# Patient Record
Sex: Male | Born: 1965 | Race: Black or African American | Marital: Single
Health system: Northeastern US, Community
[De-identification: ages and names within clinical notes are randomized; demographics above are authoritative.]

## PROBLEM LIST (undated history)

## (undated) DIAGNOSIS — I10 Essential (primary) hypertension: Secondary | ICD-10-CM

## (undated) DIAGNOSIS — J45909 Unspecified asthma, uncomplicated: Secondary | ICD-10-CM

## (undated) DIAGNOSIS — M1990 Unspecified osteoarthritis, unspecified site: Secondary | ICD-10-CM

## (undated) DIAGNOSIS — J189 Pneumonia, unspecified organism: Secondary | ICD-10-CM

## (undated) DIAGNOSIS — E119 Type 2 diabetes mellitus without complications: Secondary | ICD-10-CM

## (undated) HISTORY — PX: ROTATOR CUFF REPAIR: SHX139

## (undated) HISTORY — PX: HIP SURGERY: SHX245

---

## 2014-06-25 ENCOUNTER — Encounter (HOSPITAL_BASED_OUTPATIENT_CLINIC_OR_DEPARTMENT_OTHER): Payer: Self-pay

## 2014-06-25 ENCOUNTER — Emergency Department (HOSPITAL_BASED_OUTPATIENT_CLINIC_OR_DEPARTMENT_OTHER)
Admission: RE | Admit: 2014-06-25 | Payer: Self-pay | Source: Emergency Department | Attending: Emergency Medicine | Admitting: Emergency Medicine

## 2014-06-25 HISTORY — DX: Essential (primary) hypertension: I10

## 2014-06-25 LAB — BASIC METABOLIC PANEL
ANION GAP: 7 mmol/L (ref 5–15)
BUN (UREA NITROGEN): 27 mg/dL — ABNORMAL HIGH (ref 7–18)
CALCIUM: 9 mg/dL (ref 8.5–10.1)
CARBON DIOXIDE: 27 mmol/L (ref 21–32)
CHLORIDE: 107 mmol/L (ref 98–107)
CREATININE: 0.9 mg/dL (ref 0.7–1.2)
ESTIMATED GLOMERULAR FILT RATE: >60 mL/min (ref >60)
Glucose Random: 115 mg/dL (ref 74–160)
POTASSIUM: 4.4 mmol/L (ref 3.5–5.1)
SODIUM: 141 mmol/L (ref 136–145)

## 2014-06-25 LAB — HOLD GREEN TOP TUBE

## 2014-06-25 MED ORDER — PROCHLORPERAZINE EDISYLATE 5 MG/ML IJ SOLN
10.00 mg | Freq: Once | INTRAMUSCULAR | Status: AC
Start: 2014-06-25 — End: 2014-06-25
  Administered 2014-06-25: 10 mg via INTRAVENOUS
  Filled 2014-06-25: qty 2

## 2014-06-25 MED ORDER — SODIUM CHLORIDE 0.9 % IV BOLUS
1000.0000 mL | Freq: Once | INTRAVENOUS | Status: AC
Start: 2014-06-25 — End: 2014-06-25
  Administered 2014-06-25: 1000 mL via INTRAVENOUS

## 2014-06-25 NOTE — ED Triage Note (Signed)
P/W elevated BP, H/A and nausea.  + photophobia.  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  178/118.  Pt was given script for Lisinopril but has yet to take.

## 2014-06-25 NOTE — ED Provider Notes (Signed)
The patient was seen primarily by me. ED nursing record was reviewed. Select prior records as available electronically through the Epic record were reviewed.      HPI:    Kevin Gill is a 49 year old male patient who presents with headache.  The headache started gradually about 2 hours prior to coming to the emergency department.  It is similar to headaches that he has had in the past.  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  The pressure was approximately 180/110.  He called his doctor, thinking that his blood pressure may be responsible for the headache.  His doctor told him to proceed here.    The patient has a history of hypertension.  He was just started on new medication yesterday at his annual visit with his doctor, but had yet to pick up the medication.  He did take a dose of his lisinopril prior to arriving here today.    On review of systems, the patient denies any chest pain or shortness of breath.  He has had some nausea, photophobia, and phonophobia accompanying this headache, as he has had before.    ROS: Pertinent positives were reviewed as per the HPI above.     All other systems were reviewed and are negative.    Kevin Gill  Language of care: English  MRN: [id number]  PCP: No primary care provider on file.  Mode of arrival to ED: Relative.  Arrival time: 06/25/2014 10:41 AM  Chief complaint: Headache (NAUSEA,HEAD ACHE AND ?HIGH BP)    Past Medical History/Problem list:    Past Medical History    HTN (hypertension)      There is no problem list on file for this patient.    Past Surgical History: History reviewed. No pertinent past surgical history.  Social History:   Tobacco:No     Allergies: Review of Patient's Allergies indicates:  No Known Allergies    Immunizations:   There is no immunization history on file for this patient.  Family Hx: noncontributory     Triage Vital Signs:   ED Triage Vitals   Enc Vitals Group       BP 06/25/14 1124 152/91      Pulse 06/25/14 1124 97      Resp 06/25/14 1124 16      Temp 06/25/14 1124 98.6 F      Temp src --       SpO2 06/25/14 1124 98 %      Weight --       Height --       Head Cir --       Peak Flow --       Pain Score --       Pain Loc --       Pain Edu? --       Excl. in GC? --        Medications:  Prior to Admission Medications   Prescriptions Last Dose Informant Patient Reported? Taking?   LISINOPRIL PO   Yes Yes   Sig: Take by mouth      Facility-Administered Medications: None     Physical Exam:   GENERAL: No acute distress, non-toxic     HEAD: No evidence of trauma.   Sclerae anicteric.   Normal posterior oropharynx. Moist mucous membranes.   Pupils equal & reactive, extraocular movements intact.   Visual fields intact to confrontation  NECK: Supple. No stridor.    LUNGS: Clear to auscultation bilaterally. No wheezes, rales, rhonchi.     HEART: Regular rate & rhythm.  No murmurs appreciated.      ABDOMEN: Soft, nontender. No masses appreciated.  No involuntary guarding or rebound tenderness.     EXTREMITIES: No obvious deformities or trauma.  Warm and well perfused.  No edema.     SKIN: Warm and dry, no rash    NEUROLOGIC: Alert and oriented x3; moves all extremities well; speaking in clear fluent sentences.  Cranial nerves II through XII are intact.  Finger-nose testing is within normal limits.  Normal gait without ataxia. Sensation intact to light touch throughout.     PSYCHIATRIC: Appropriate, cooperative.       Medications Given in the ED:  Medications - No data to display Radiology Results:  N/A   Lab Results :  Results for orders placed or performed during the hospital encounter of 06/25/14 (from the past 24 hour(s))   Basic Metabolic Panel    Collection Time: 06/25/14 12:22 PM   Result Value    SODIUM 141    POTASSIUM 4.4    CHLORIDE 107    CARBON DIOXIDE 27    ANION GAP 7    CALCIUM 9.0    Glucose Random 115    BUN (UREA NITROGEN) 27 (H)    CREATININE 0.9    ESTIMATED  GLOMERULAR FILT RATE > 60   Hold Green Top Tube    Collection Time: 06/25/14 12:22 PM   Result Value    HOLD GREEN TOP TUBE RECEIVED IN CHEM    Other Results (e.g. ECG):  Normal sinus rhythm at 62.  Normal axis, QRS 106.  No acute ischemic changes or LVH.     ED Course and Medical Decision-making:  49 year old male patient signs and symptoms consistent with a common migraine headache.  The patient denies having any type of an aura.  His pressure does not seem to have been high enough to think that this might of been a hypertensive urgency.    His neurologic examination is reassuring.  EKG does not reveal any LVH or acute ischemia, and the patient's BMP was within normal limits.    The patient received Compazine for symptom relief here in the emergency department, and I was hoping to reassess him.  However, the patient eloped from the emergency department and returned to work.  Nursing was later able to call him at his place of business, and the patient reported feeling much better and not wanting to disturb us while you're seeing other patients.    Disposition: Eloped    Diagnosis/Diagnoses:  1.  Headache, possible migraine  2.  Hypertension     Huntley EstelleStephen Willim Turnage, MD  06/25/2014   Dept.of Emergency Medicine  [facility]    This Emergency Department patient encounter note was created using voice-recognition software and in real time during the ED visit. Please excuse any typographical errors that have not yet been reviewed and corrected.

## 2014-06-25 NOTE — Narrator Note (Signed)
Pt was feeling much better after med and IV fluid.  Pt wanted to leave.  Pt pulled out his IV and placed in sharps container.  Pt eloped.  Pt was called at his place of work to f/u and confirm IV was removed.  Pt stated he is now pain free and back at work.  " I saw there were many more people sicker than me and I know you needed to help them so I left...Marland Kitchen.Marland Kitchen.Im  sorry"

## 2014-06-25 NOTE — Discharge Instructions (Signed)

## 2014-06-28 LAB — EKG

## 2016-04-04 ENCOUNTER — Emergency Department (HOSPITAL_COMMUNITY)
Admission: EM | Admit: 2016-04-04 | Discharge: 2016-04-04 | Disposition: A | Discharge status: Home / Self Care | Payer: Managed Care, Other (non HMO) | Attending: Emergency Medicine | Admitting: Emergency Medicine

## 2016-04-04 ENCOUNTER — Encounter (HOSPITAL_COMMUNITY): Payer: Self-pay

## 2016-04-04 DIAGNOSIS — E1165 Type 2 diabetes mellitus with hyperglycemia: Secondary | ICD-10-CM | POA: Insufficient documentation

## 2016-04-04 DIAGNOSIS — R739 Hyperglycemia, unspecified: Secondary | ICD-10-CM

## 2016-04-04 HISTORY — DX: Type 2 diabetes mellitus without complications: E11.9

## 2016-04-04 LAB — URINALYSIS, ROUTINE W REFLEX MICROSCOPIC
BACTERIA UA: NONE SEEN
Bilirubin Urine: NEGATIVE
Glucose, UA: >=500 mg/dL — AB
Ketones, ur: 20 mg/dL — AB
Leukocytes, UA: NEGATIVE
Nitrite: NEGATIVE
PROTEIN: 30 mg/dL — AB
Specific Gravity, Urine: 1.026 (ref 1.005–1.030)
pH: 5 (ref 5.0–8.0)

## 2016-04-04 LAB — CBG MONITORING, ED
GLUCOSE-CAPILLARY: 343 mg/dL — AB (ref 65–99)
Glucose-Capillary: 383 mg/dL — ABNORMAL HIGH (ref 65–99)
Glucose-Capillary: 276 mg/dL — ABNORMAL HIGH (ref 65–99)

## 2016-04-04 LAB — COMPREHENSIVE METABOLIC PANEL
ALBUMIN: 4.5 g/dL (ref 3.5–5.0)
ALT: 31 U/L (ref 17–63)
AST: 25 U/L (ref 15–41)
Alkaline Phosphatase: 81 U/L (ref 38–126)
Anion gap: 16 — ABNORMAL HIGH (ref 5–15)
BILIRUBIN TOTAL: 1 mg/dL (ref 0.3–1.2)
BUN: 23 mg/dL — AB (ref 6–20)
CO2: 22 mmol/L (ref 22–32)
Calcium: 9.5 mg/dL (ref 8.9–10.3)
Chloride: 94 mmol/L — ABNORMAL LOW (ref 101–111)
Creatinine, Ser: 1.53 mg/dL — ABNORMAL HIGH (ref 0.61–1.24)
GFR calc Af Amer: 59 mL/min — ABNORMAL LOW (ref >60)
GFR calc non Af Amer: 51 mL/min — ABNORMAL LOW (ref >60)
Glucose, Bld: 384 mg/dL — ABNORMAL HIGH (ref 65–99)
POTASSIUM: 4.1 mmol/L (ref 3.5–5.1)
Sodium: 132 mmol/L — ABNORMAL LOW (ref 135–145)
TOTAL PROTEIN: 7.6 g/dL (ref 6.5–8.1)

## 2016-04-04 LAB — I-STAT VENOUS BLOOD GAS, ED
Bicarbonate: 24 mmol/L (ref 20.0–28.0)
O2 Saturation: 85 %
PCO2 VEN: 37.7 mmHg — AB (ref 44.0–60.0)
PO2 VEN: 49 mmHg — AB (ref 32.0–45.0)
TCO2: 25 mmol/L (ref 0–100)
pH, Ven: 7.412 (ref 7.250–7.430)

## 2016-04-04 LAB — CBC
HEMATOCRIT: 42.3 % (ref 39.0–52.0)
Hemoglobin: 14.6 g/dL (ref 13.0–17.0)
MCH: 30.6 pg (ref 26.0–34.0)
MCHC: 34.5 g/dL (ref 30.0–36.0)
MCV: 88.7 fL (ref 78.0–100.0)
Platelets: 325 10*3/uL (ref 150–400)
RBC: 4.77 MIL/uL (ref 4.22–5.81)
RDW: 12.7 % (ref 11.5–15.5)
WBC: 8.4 10*3/uL (ref 4.0–10.5)

## 2016-04-04 LAB — I-STAT TROPONIN, ED: Troponin i, poc: 0 ng/mL (ref 0.00–0.08)

## 2016-04-04 MED ORDER — SODIUM CHLORIDE 0.9 % IV BOLUS (SEPSIS)
1000.0000 mL | Freq: Once | INTRAVENOUS | Status: AC
  Administered 2016-04-04: 1000 mL via INTRAVENOUS

## 2016-04-04 MED ORDER — INSULIN ASPART 100 UNIT/ML ~~LOC~~ SOLN
10.0000 [IU] | Freq: Once | SUBCUTANEOUS | Status: AC
  Administered 2016-04-04: 10 [IU] via INTRAVENOUS
  Filled 2016-04-04: qty 1

## 2016-04-04 MED ORDER — ONDANSETRON HCL 4 MG/2ML IJ SOLN
4.0000 mg | Freq: Once | INTRAMUSCULAR | Status: AC
  Administered 2016-04-04: 4 mg via INTRAVENOUS
  Filled 2016-04-04: qty 2

## 2016-04-04 NOTE — ED Notes (Signed)
Pt fluids still running, per Dr. Anitra LauthPlunkett will recheck pt CBG after second bolus complete.

## 2016-04-04 NOTE — ED Notes (Signed)
ED Provider at bedside. 

## 2016-04-04 NOTE — ED Provider Notes (Signed)
[facility]-EMERGENCY DEPT Provider Note   CSN: [id number] Arrival date & time: 04/04/16  1550     History   Chief Complaint Chief Complaint  Patient presents with  . Hyperglycemia    HPI Kenneth Barron is a 51 y.o. male.  Patient is a 51 year old male with a history of diabetes who is currently on metformin presenting today with 5 days of abdominal cramping, intermittent vomiting, polyuria, polydipsia and feeling lightheaded today. He denies any shortness of breath or chest pain.  He was seen by his PCP today and found to have an elevated blood sugar of 400. He was sent here for further care. He denies any cough, congestion, shortness of breath, chest pain. No recent illness with fever. He denies any dysuria. He initially was having diarrhea several days ago but has had no further diarrhea today. Eating seems to make his symptoms worse. He was only able to eat several strawberries today. Then he started to feel nauseated. He recently started juicing in the last 10 days and thinks that that might be the cause of his elevated blood sugar. 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   The history is provided by the patient.    No past medical history on file.  There are no active problems to display for this patient.   No past surgical history on file.     Home Medications    Prior to Admission medications   Not on File    Family History No family history on file.  Social History Social History  Substance Use Topics  . Smoking status: Not on file  . Smokeless tobacco: Not on file  . Alcohol use Not on file     Allergies   Patient has no allergy information on record.   Review of Systems Review of Systems  All other systems reviewed and are negative.    Physical Exam Updated Vital Signs BP 146/69 (BP Location: Left Arm)   Pulse 73   Resp 18   SpO2 100%   Physical Exam   Constitutional: He is oriented to person, place, and time. He appears well-developed and well-nourished. No distress.  HENT:  Head: Normocephalic and atraumatic.  Mouth/Throat: Oropharynx is clear and moist. Mucous membranes are dry.  Eyes: Conjunctivae and EOM are normal. Pupils are equal, round, and reactive to light.  Neck: Normal range of motion. Neck supple.  Cardiovascular: Normal rate, regular rhythm and intact distal pulses.   No murmur heard. Pulmonary/Chest: Effort normal and breath sounds normal. No respiratory distress. He has no wheezes. He has no rales.  Abdominal: Soft. He exhibits no distension. There is no tenderness. There is no rebound and no guarding.  Musculoskeletal: Normal range of motion. He exhibits no edema or tenderness.  Neurological: He is alert and oriented to person, place, and time.  Skin: Skin is warm and dry. No rash noted. No erythema.  Psychiatric: He has a normal mood and affect. His behavior is normal.  Nursing note and vitals reviewed.    ED Treatments / Results  Labs (all labs ordered are listed, but only abnormal results are displayed) Labs Reviewed  URINALYSIS, ROUTINE W REFLEX MICROSCOPIC - Abnormal; Notable for the following:       Result Value   Glucose, UA >=500 (*)    Hgb urine dipstick MODERATE (*)    Ketones, ur 20 (*)    Protein, ur  30 (*)    Squamous Epithelial / LPF 0-5 (*)    All other components within normal limits  COMPREHENSIVE METABOLIC PANEL - Abnormal; Notable for the following:    Sodium 132 (*)    Chloride 94 (*)    Glucose, Bld 384 (*)    BUN 23 (*)    Creatinine, Ser 1.53 (*)    GFR calc non Af Amer 51 (*)    GFR calc Af Amer 59 (*)    Anion gap 16 (*)    All other components within normal limits  CBG MONITORING, ED - Abnormal; Notable for the following:    Glucose-Capillary 383 (*)    All other components within normal limits  CBG MONITORING, ED - Abnormal; Notable for the following:    Glucose-Capillary  343 (*)    All other components within normal limits  I-STAT VENOUS BLOOD GAS, ED - Abnormal; Notable for the following:    pCO2, Ven 37.7 (*)    pO2, Ven 49.0 (*)    All other components within normal limits  CBG MONITORING, ED - Abnormal; Notable for the following:    Glucose-Capillary 276 (*)    All other components within normal limits  CBC  I-STAT TROPOININ, ED  CBG MONITORING, ED    EKG  EKG Interpretation  Date/Time:  Tuesday April 04 2016 16:00:42 EST Ventricular Rate:  81 PR Interval:  146 QRS Duration: 112 QT Interval:  384 QTC Calculation: 446 R Axis:   56 Text Interpretation:  Normal sinus rhythm Nonspecific ST and T wave abnormality No previous tracing Confirmed by Anitra LauthPLUNKETT  MD, Alphonzo LemmingsWHITNEY ([id number]) on 04/04/2016 4:04:39 PM       Radiology No results found.  Procedures Procedures (including critical care time)  Medications Ordered in ED Medications  ondansetron (ZOFRAN) injection 4 mg (not administered)  sodium chloride 0.9 % bolus 1,000 mL (not administered)     Initial Impression / Assessment and Plan / ED Course  I have reviewed the triage vital signs and the nursing notes.  Pertinent labs & imaging results that were available during my care of the patient were reviewed by me and considered in my medical decision making (see chart for details).     Patient presenting from PCP office with hyperglycemia and rule out DKA. Patient does not smell of ketones on exam but has been having vomiting, abdominal cramping and elevated blood sugar despite taking his metformin. He has been juicing the last 10 days which may also be a cause of his elevated blood sugar. He denies any infectious symptoms.  He denies any chest pain, shortness of breath but was lightheaded today. His EKG is abnormal with nonspecific ST and T-wave abnormalities in the inferior lateral leads however there is no old to compare. Blood sugar here is 383 and patient was started on IV fluids and  Zofran. CBC, CMP, troponin, UA, VBG pending  7:50 PM Labs without strong evidence for DKA. After 2 L of fluid will repeat check CBG. Patient given some diet ginger ale and crackers.  Repeat blood sugar after 2L is unchanged at 343.  Pt given insulin. And will recheck  10:42 PM After insulin pt's BS is now in the 200's.  Pt tolerating po's.  Will d/c home and has pcp f/u.  Final Clinical Impressions(s) / ED Diagnoses   Final diagnoses:  Hyperglycemia    New Prescriptions New Prescriptions   No medications on file     Gwyneth SproutWhitney Wiley Flicker, MD 04/04/16 2242

## 2016-04-04 NOTE — ED Triage Notes (Signed)
Pt presents via EMS from PCP for hyperglycemia. Per EMS, pt to PCP for increased thirst, N/V, "stomach turning", and "feeling foggy". CBG 362 at PCP, CBG 437 en route with EMS. Pt with hx Type 2 Dm. 128/76, HR 70, RR 16. 20 G in L AC.

## 2016-04-20 ENCOUNTER — Encounter (HOSPITAL_COMMUNITY): Payer: Self-pay

## 2016-04-20 ENCOUNTER — Emergency Department (HOSPITAL_COMMUNITY)
Admission: EM | Admit: 2016-04-20 | Discharge: 2016-04-20 | Disposition: A | Discharge status: Home / Self Care | Payer: Managed Care, Other (non HMO) | Attending: Emergency Medicine | Admitting: Emergency Medicine

## 2016-04-20 DIAGNOSIS — Z5321 Procedure and treatment not carried out due to patient leaving prior to being seen by health care provider: Secondary | ICD-10-CM | POA: Insufficient documentation

## 2016-04-20 DIAGNOSIS — R739 Hyperglycemia, unspecified: Secondary | ICD-10-CM | POA: Insufficient documentation

## 2016-04-20 LAB — CBG MONITORING, ED: Glucose-Capillary: 355 mg/dL — ABNORMAL HIGH (ref 65–99)

## 2016-04-20 NOTE — ED Triage Notes (Signed)
Pt reports he has been suffering from a head cold for about a week. He checked his blood sugar today and it was 509. He reports headache and nausea. CBG at triage 355.

## 2016-04-20 NOTE — ED Notes (Signed)
Pt reports "since my blood sugar wasn't as high as my meter read" he politely requested to be discharged and declined any further testing or blood draw. Pt encouraged to wait to be evaluated by a provider and they can discuss his plan of care.

## 2016-04-20 NOTE — ED Notes (Signed)
Pt called x2 with no response °

## 2017-04-13 ENCOUNTER — Encounter (HOSPITAL_COMMUNITY): Payer: Self-pay

## 2017-04-13 ENCOUNTER — Emergency Department (HOSPITAL_COMMUNITY)
Admission: EM | Admit: 2017-04-13 | Discharge: 2017-04-13 | Disposition: A | Discharge status: Home / Self Care | Payer: BLUE CROSS/BLUE SHIELD | Attending: Emergency Medicine | Admitting: Emergency Medicine

## 2017-04-13 ENCOUNTER — Other Ambulatory Visit: Payer: Self-pay

## 2017-04-13 ENCOUNTER — Emergency Department (HOSPITAL_COMMUNITY): Payer: BLUE CROSS/BLUE SHIELD

## 2017-04-13 DIAGNOSIS — Z79899 Other long term (current) drug therapy: Secondary | ICD-10-CM | POA: Diagnosis not present

## 2017-04-13 DIAGNOSIS — I10 Essential (primary) hypertension: Secondary | ICD-10-CM | POA: Insufficient documentation

## 2017-04-13 DIAGNOSIS — E860 Dehydration: Secondary | ICD-10-CM | POA: Insufficient documentation

## 2017-04-13 DIAGNOSIS — R112 Nausea with vomiting, unspecified: Secondary | ICD-10-CM | POA: Diagnosis present

## 2017-04-13 DIAGNOSIS — Z794 Long term (current) use of insulin: Secondary | ICD-10-CM | POA: Diagnosis not present

## 2017-04-13 DIAGNOSIS — E119 Type 2 diabetes mellitus without complications: Secondary | ICD-10-CM | POA: Diagnosis not present

## 2017-04-13 HISTORY — DX: Essential (primary) hypertension: I10

## 2017-04-13 LAB — URINALYSIS, ROUTINE W REFLEX MICROSCOPIC
Bilirubin Urine: NEGATIVE
Glucose, UA: NEGATIVE mg/dL
Hgb urine dipstick: NEGATIVE
Ketones, ur: NEGATIVE mg/dL
Leukocytes, UA: NEGATIVE
Nitrite: NEGATIVE
Protein, ur: NEGATIVE mg/dL
Specific Gravity, Urine: 1.025 (ref 1.005–1.030)
pH: 5 (ref 5.0–8.0)

## 2017-04-13 LAB — COMPREHENSIVE METABOLIC PANEL
ALBUMIN: 4.1 g/dL (ref 3.5–5.0)
ALT: 19 U/L (ref 17–63)
ANION GAP: 9 (ref 5–15)
AST: 20 U/L (ref 15–41)
Alkaline Phosphatase: 92 U/L (ref 38–126)
BUN: 34 mg/dL — ABNORMAL HIGH (ref 6–20)
CHLORIDE: 105 mmol/L (ref 101–111)
CO2: 25 mmol/L (ref 22–32)
Calcium: 8.9 mg/dL (ref 8.9–10.3)
Creatinine, Ser: 1.14 mg/dL (ref 0.61–1.24)
GFR calc Af Amer: >60 mL/min (ref >60)
GFR calc non Af Amer: >60 mL/min (ref >60)
GLUCOSE: 129 mg/dL — AB (ref 65–99)
Potassium: 4.3 mmol/L (ref 3.5–5.1)
Sodium: 139 mmol/L (ref 135–145)
Total Bilirubin: 0.6 mg/dL (ref 0.3–1.2)
Total Protein: 7 g/dL (ref 6.5–8.1)

## 2017-04-13 LAB — CBC
HCT: 41.3 % (ref 39.0–52.0)
Hemoglobin: 13.5 g/dL (ref 13.0–17.0)
MCH: 32.4 pg (ref 26.0–34.0)
MCHC: 32.7 g/dL (ref 30.0–36.0)
MCV: 99 fL (ref 78.0–100.0)
Platelets: 290 K/uL (ref 150–400)
RBC: 4.17 MIL/uL — ABNORMAL LOW (ref 4.22–5.81)
RDW: 14.2 % (ref 11.5–15.5)
WBC: 6.3 K/uL (ref 4.0–10.5)

## 2017-04-13 LAB — CBG MONITORING, ED: Glucose-Capillary: 130 mg/dL — ABNORMAL HIGH (ref 65–99)

## 2017-04-13 LAB — TROPONIN I: Troponin I: <0.03 ng/mL (ref <0.03)

## 2017-04-13 LAB — LIPASE, BLOOD: LIPASE: 59 U/L — AB (ref 11–51)

## 2017-04-13 MED ORDER — METOCLOPRAMIDE HCL 5 MG/ML IJ SOLN
10.0000 mg | Freq: Once | INTRAMUSCULAR | Status: DC
  Filled 2017-04-13: qty 2

## 2017-04-13 MED ORDER — SODIUM CHLORIDE 0.9 % IV BOLUS (SEPSIS)
1000.0000 mL | Freq: Once | INTRAVENOUS | Status: AC
  Administered 2017-04-13: 1000 mL via INTRAVENOUS

## 2017-04-13 MED ORDER — ONDANSETRON HCL 4 MG/2ML IJ SOLN: 4.0000 mg | Freq: Once | INTRAMUSCULAR | Status: DC

## 2017-04-13 MED ORDER — IOPAMIDOL (ISOVUE-300) INJECTION 61%
INTRAVENOUS | Status: AC
  Administered 2017-04-13: 100 mL
  Filled 2017-04-13: qty 100

## 2017-04-13 MED ORDER — ONDANSETRON HCL 4 MG PO TABS: 4.0000 mg | ORAL_TABLET | Freq: Four times a day (QID) | ORAL | 0 refills | Status: DC

## 2017-04-13 MED ORDER — ONDANSETRON HCL 4 MG/2ML IJ SOLN
4.0000 mg | Freq: Once | INTRAMUSCULAR | Status: AC
  Administered 2017-04-13: 4 mg via INTRAVENOUS

## 2017-04-13 MED ORDER — KETOROLAC TROMETHAMINE 30 MG/ML IJ SOLN
15.0000 mg | Freq: Once | INTRAMUSCULAR | Status: AC
  Administered 2017-04-13: 15 mg via INTRAVENOUS
  Filled 2017-04-13: qty 1

## 2017-04-13 MED ORDER — ONDANSETRON HCL 4 MG/2ML IJ SOLN
4.0000 mg | Freq: Once | INTRAMUSCULAR | Status: DC | PRN
  Filled 2017-04-13: qty 2

## 2017-04-13 MED ORDER — SODIUM CHLORIDE 0.9 % IJ SOLN
INTRAMUSCULAR | Status: AC
  Filled 2017-04-13: qty 50

## 2017-04-13 NOTE — ED Triage Notes (Signed)
Patient BIB EMS from home with c/o Nausea/vomting today, and associated diarrhea last night. Patient reports feeling dizzy and weakness. 20G L Hand PIV placed by EMS. No medications administered by EMS. VSS

## 2017-04-13 NOTE — ED Notes (Signed)
Bed: WA05 Expected date:  Expected time:  Means of arrival:  Comments: EMS 

## 2017-04-13 NOTE — ED Provider Notes (Signed)
[facility]-EMERGENCY DEPT Provider Note   CSN: [id number] Arrival date & time: 04/13/17  1232     History   Chief Complaint Chief Complaint  Patient presents with  . Emesis  . Weakness    HPI Kenneth Barron is a 52 y.o. male with history of type 2 diabetes who presents with nausea, emesis, and generalized weakness.  He denies abdominal pain.  Patient reports his symptoms came on suddenly today. He did eat breakfast.  He  gave himself 4 units of insulin which did not seem to improve his symptoms, like it has in the past.. Patient denies any fevers, chest pain, shortness of breath.  Patient reports he has chronic diarrhea with his metformin.  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  Patient is being followed by orthopedics.  He denies any saddle anesthesia or loss of bowel or bladder incontinence.  Symptoms have increased since he saw orthopedics 1 month ago, however plans for MRI of his lumbar spine were discussed.  The symptoms are unchanged today since onset of his other symptoms.  HPI  Past Medical History:  Diagnosis Date  . Diabetes mellitus without complication (HCC)    Type 2  . Hypertension     There are no active problems to display for this patient.   History reviewed. No pertinent surgical history.     Home Medications    Prior to Admission medications   Medication Sig Start Date End Date Taking? Authorizing Provider  amLODipine (NORVASC) 10 MG tablet Take 10 mg by mouth daily. 02/13/17  Yes [provider]  atorvastatin (LIPITOR) 20 MG tablet Take 20 mg by mouth daily. 03/10/17  Yes [provider]  diclofenac (VOLTAREN) 75 MG EC tablet Take 75 mg by mouth 2 (two) times daily. 01/31/17  Yes [provider]  insulin degludec (TRESIBA) 100 UNIT/ML SOPN FlexTouch Pen Inject 10 Units into the skin daily. 11/09/16 01/31/22 Yes [provider]  lisinopril-hydrochlorothiazide (PRINZIDE,ZESTORETIC) 20-25 MG tablet Take 1 tablet by mouth daily. 01/11/17  Yes [provider]  metFORMIN (GLUCOPHAGE-XR) 500 MG 24 hr tablet Take 1,000 mg by mouth 2 (two) times daily. 03/10/17  Yes [provider]  sertraline (ZOLOFT) 50 MG tablet Take 50 mg by mouth daily. 03/10/17  Yes [provider]  ondansetron (ZOFRAN) 4 MG tablet Take 1 tablet (4 mg total) by mouth every 6 (six) hours. 04/13/17   Emi Holes, PA-C    Family History History reviewed. No pertinent family history.  Social History Social History   Tobacco Use  . Smoking status: Unknown If Ever Smoked  . Smokeless tobacco: Never Used  Substance Use Topics  . Alcohol use: Not on file  . Drug use: Not on file     Allergies   Patient has no known allergies.   Review of Systems Review of Systems  Constitutional: Negative for chills and fever.  HENT: Negative for facial swelling and sore throat.   Respiratory: Negative for shortness of breath.   Cardiovascular: Negative for chest pain.  Gastrointestinal: Positive for diarrhea (chronic), nausea and vomiting. Negative for abdominal pain.  Genitourinary: Negative for dysuria.  Musculoskeletal: Negative for back pain.  Skin: Negative for rash and wound.  Neurological: Negative for headaches.  Psychiatric/Behavioral: The patient is not nervous/anxious.      Physical Exam Updated Vital Signs BP (!) 150/90   Pulse (!) 56  Temp 98.1 F (36.7 C) (Oral)   Resp 19   Wt 136.1 kg (300 lb)   SpO2 100%   BMI 43.05 kg/m   Physical Exam  Constitutional: He appears well-developed and well-nourished. No distress.  HENT:  Head: Normocephalic and atraumatic.  Mouth/Throat: Oropharynx is clear and moist. No oropharyngeal exudate.  Eyes: Conjunctivae are normal. Pupils are equal, round, and reactive to light. Right eye exhibits no discharge. Left eye exhibits no discharge. No scleral  icterus.  Neck: Normal range of motion. Neck supple. No thyromegaly present.  Cardiovascular: Normal rate, regular rhythm, normal heart sounds and intact distal pulses. Exam reveals no gallop and no friction rub.  No murmur heard. Pulmonary/Chest: Effort normal and breath sounds normal. No stridor. No respiratory distress. He has no wheezes. He has no rales.  Abdominal: Soft. Bowel sounds are normal. He exhibits no distension. There is no tenderness. There is no rebound and no guarding.  Musculoskeletal: He exhibits no edema.  No midline cervical, thoracic, or lumbar tenderness No palpation to the musculature of the back  Lymphadenopathy:    He has no cervical adenopathy.  Neurological: He is alert. Coordination normal.  5/5 strength to bilateral lower extremities Sensation intact  Skin: Skin is warm and dry. No rash noted. He is not diaphoretic. No pallor.  Psychiatric: He has a normal mood and affect.  Nursing note and vitals reviewed.    ED Treatments / Results  Labs (all labs ordered are listed, but only abnormal results are displayed) Labs Reviewed  LIPASE, BLOOD - Abnormal; Notable for the following components:      Result Value   Lipase 59 (*)    All other components within normal limits  COMPREHENSIVE METABOLIC PANEL - Abnormal; Notable for the following components:   Glucose, Bld 129 (*)    BUN 34 (*)    All other components within normal limits  CBC - Abnormal; Notable for the following components:   RBC 4.17 (*)    All other components within normal limits  CBG MONITORING, ED - Abnormal; Notable for the following components:   Glucose-Capillary 130 (*)    All other components within normal limits  URINALYSIS, ROUTINE W REFLEX MICROSCOPIC  TROPONIN I    EKG  EKG Interpretation  Date/Time:  Friday April 13 2017 12:52:20 EST Ventricular Rate:  48 PR Interval:    QRS Duration: 114 QT Interval:  424 QTC Calculation: 379 R Axis:   56 Text Interpretation:   Sinus bradycardia Anterior infarct, old Confirmed by Tilden Fossa [phone]) on 04/13/2017 2:37:52 PM       Radiology Ct Abdomen Pelvis W Contrast  Result Date: 04/13/2017 CLINICAL DATA:  Nausea, vomiting, and diarrhea for the past day. History of small bowel obstruction. EXAM: CT ABDOMEN AND PELVIS WITH CONTRAST TECHNIQUE: Multidetector CT imaging of the abdomen and pelvis was performed using the standard protocol following bolus administration of intravenous contrast. CONTRAST:  ISOVUE-300 IOPAMIDOL (ISOVUE-300) INJECTION 61% COMPARISON:  None. FINDINGS: Lower chest: No acute abnormality. Bibasilar and lingular atelectasis. 4 mm subpleural nodule in the right middle lobe (series 4, image 12). Hepatobiliary: No focal liver abnormality is seen. No gallstones, gallbladder wall thickening, or biliary dilatation. Pancreas: Unremarkable. No pancreatic ductal dilatation or surrounding inflammatory changes. Spleen: Normal in size without focal abnormality. Adrenals/Urinary Tract: Adrenal glands are unremarkable. Kidneys are normal, without renal calculi, focal lesion, or hydronephrosis. Bladder is unremarkable. Stomach/Bowel: Stomach is within normal limits. Appendix appears normal. No evidence of bowel wall thickening,  distention, or inflammatory changes. Scattered colonic diverticula. Vascular/Lymphatic: No significant vascular findings are present. No enlarged abdominal or pelvic lymph nodes. Reproductive: Prostate is unremarkable. Other: No free fluid or pneumoperitoneum. Musculoskeletal: Prior right total hip arthroplasty. Eccentric positioning of the femoral head within the acetabular cup. There is lucency and expansion of the right puboacetabular junction. There is also lucency around the posterior acetabular cup and proximal femoral stem. No acute fracture. Atrophy of the right iliopsoas muscle. IMPRESSION: 1.  No acute intra-abdominal process.  No bowel obstruction. 2. 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 3. Subpleural 4 mm pulmonary nodule in the right middle lobe. No follow-up needed if patient is low-risk. Non-contrast chest CT can be considered in 12 months if patient is high-risk. This recommendation follows the consensus statement: Guidelines for Management of Incidental Pulmonary Nodules Detected on CT Images: From the Fleischner Society 2017; Radiology 2017; 284:228-243. Electronically Signed   By: Obie DredgeWilliam T Derry M.D.   On: 04/13/2017 15:47    Procedures Procedures (including critical care time)  Medications Ordered in ED Medications  sodium chloride 0.9 % bolus 1,000 mL (0 mLs Intravenous Stopped 04/13/17 1453)  ketorolac (TORADOL) 30 MG/ML injection 15 mg (15 mg Intravenous Given 04/13/17 1406)  ondansetron (ZOFRAN) injection 4 mg (4 mg Intravenous Given 04/13/17 1419)  iopamidol (ISOVUE-300) 61 % injection (100 mLs  Contrast Given 04/13/17 1519)     Initial Impression / Assessment and Plan / ED Course  I have reviewed the triage vital signs and the nursing notes.  Pertinent labs & imaging results that were available during my care of the patient were reviewed by me and considered in my medical decision making (see chart for details).     Patient with nausea and fatigue.  He is feeling much better after Zofran, Toradol, fluids in the ED.  Labs and urine are unremarkable except mild dehydration with BUN 34 and lipase 59.  This mild elevation of lipase is not seen significant enough to be causing symptoms, although patient does have pancreatitis.  Troponin is negative.  CT of the pelvis shows no acute intra-abdominal process or bowel obstruction; prior right total hip arthroplasty with evidence of asymmetric polyethylene wear, particle disease, and loosening; subpleural 4 mm pulmonary nodule in the right middle lobe.  Regarding patient's hip, he is currently being followed by his orthopedist and  hardware loosening was already suspected.  Advised him to follow-up with his orthopedist as soon as possible with this information.  Regarding pulmonary nodule, patient has never smoked cigarettes, however he does smoke marijuana.  Advised to follow-up with his PCP for further monitoring of this.  Patient advised to follow-up with his PCP this week for recheck.  No signs of emergent pathology at this time.  Will discharge home with Zofran, as this could be food related.  Return precautions discussed.  Patient understands and agrees with plan.  Patient vitals stable throughout ED course and discharged in satisfactory condition.  Final Clinical Impressions(s) / ED Diagnoses   Final diagnoses:  Non-intractable vomiting with nausea, unspecified vomiting type    ED Discharge Orders        Ordered    ondansetron (ZOFRAN) 4 MG tablet  Every 6 hours     04/13/17 1632       Emi HolesLaw, Jovanny Stephanie M, PA-C 04/14/17 1612    Tilden Fossaees, Elizabeth, MD 04/18/17 1242

## 2017-04-13 NOTE — Discharge Instructions (Signed)
Take Zofran every 6 hours as needed for nausea or vomiting.  Make sure to drink plenty of fluids.  Please follow-up with your doctor for further evaluation and recheck of your symptoms.  Please also follow-up with the orthopedic doctor for loosening of your hardware as well as further evaluation and treatment of your back pain.  Please return to emergency department if you develop any new or worsening symptoms.  Your CT showed: 1.  No acute intra-abdominal process.  No bowel obstruction. 2. 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 3. Subpleural 4 mm pulmonary nodule in the right middle lobe. No follow-up needed if patient is low-risk. Non-contrast chest CT can be considered in 12 months if patient is high-risk.

## 2019-05-10 ENCOUNTER — Emergency Department (HOSPITAL_COMMUNITY)
Admission: EM | Admit: 2019-05-10 | Discharge: 2019-05-11 | Disposition: A | Discharge status: Home / Self Care | Payer: 59 | Attending: Emergency Medicine | Admitting: Emergency Medicine

## 2019-05-10 ENCOUNTER — Emergency Department (HOSPITAL_COMMUNITY): Payer: 59

## 2019-05-10 ENCOUNTER — Encounter (HOSPITAL_COMMUNITY): Payer: Self-pay

## 2019-05-10 DIAGNOSIS — E119 Type 2 diabetes mellitus without complications: Secondary | ICD-10-CM | POA: Diagnosis not present

## 2019-05-10 DIAGNOSIS — Z794 Long term (current) use of insulin: Secondary | ICD-10-CM | POA: Diagnosis not present

## 2019-05-10 DIAGNOSIS — Z79899 Other long term (current) drug therapy: Secondary | ICD-10-CM | POA: Insufficient documentation

## 2019-05-10 DIAGNOSIS — M545 Low back pain: Secondary | ICD-10-CM | POA: Diagnosis present

## 2019-05-10 DIAGNOSIS — M5442 Lumbago with sciatica, left side: Secondary | ICD-10-CM | POA: Diagnosis not present

## 2019-05-10 DIAGNOSIS — I10 Essential (primary) hypertension: Secondary | ICD-10-CM | POA: Diagnosis not present

## 2019-05-10 MED ORDER — HYDROMORPHONE HCL 1 MG/ML IJ SOLN
1.0000 mg | Freq: Once | INTRAMUSCULAR | Status: DC
  Filled 2019-05-10: qty 1

## 2019-05-10 MED ORDER — KETOROLAC TROMETHAMINE 30 MG/ML IJ SOLN
30.0000 mg | Freq: Once | INTRAMUSCULAR | Status: AC
  Administered 2019-05-11: 30 mg via INTRAVENOUS
  Filled 2019-05-10: qty 1
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
## 2019-05-11 MED ORDER — HYDROCODONE-ACETAMINOPHEN 5-325 MG PO TABS: 1.0000 | ORAL_TABLET | Freq: Four times a day (QID) | ORAL | 0 refills | Status: DC | PRN

## 2019-05-11 MED ORDER — KETOROLAC TROMETHAMINE 60 MG/2ML IM SOLN
60.0000 mg | Freq: Once | INTRAMUSCULAR | Status: AC
  Administered 2019-05-11: 60 mg via INTRAMUSCULAR
  Filled 2019-05-11: qty 2

## 2019-05-11 MED ORDER — HYDROCODONE-ACETAMINOPHEN 5-325 MG PO TABS
2.0000 | ORAL_TABLET | Freq: Once | ORAL | Status: AC
Start: 2019-05-11 — End: 2019-05-11
  Administered 2019-05-11: 2 via ORAL
  Filled 2019-05-11: qty 2

## 2019-05-11 MED ORDER — HYDROMORPHONE HCL 1 MG/ML IJ SOLN
2.0000 mg | Freq: Once | INTRAMUSCULAR | Status: AC
  Administered 2019-05-11: 2 mg via INTRAMUSCULAR
  Filled 2019-05-11: qty 2

## 2019-05-11 NOTE — ED Notes (Signed)
Pt placed on O2 monitoring on Dinamap.

## 2019-05-11 NOTE — Progress Notes (Signed)
Orthopedic Tech Progress Note Patient Details:  Ruvim Risko 1965/05/26 [id number]  Ortho Devices Type of Ortho Device: Crutches Ortho Device/Splint Interventions: Ordered, Application, Adjustment  the pt was able to get up and take a few steps with the crutches. He was unsteady. I made the dr aware of this. Post Interventions Patient Tolerated: Well Instructions Provided: Care of device, Adjustment of device   Trinna Post 05/11/2019, 2:45 AM

## 2019-05-11 NOTE — ED Notes (Signed)
Discharge instructions reviewed with pt. Pt verbalized understanding.   

## 2019-05-11 NOTE — Discharge Instructions (Signed)
Please keep your appointment with the orthopedic on Monday.  If your symptoms worsen significantly, such that you have weakness (paralysis), incontinence, or fever return to the ER.

## 2019-05-12 ENCOUNTER — Other Ambulatory Visit: Payer: Self-pay | Admitting: Specialist

## 2019-05-13 ENCOUNTER — Other Ambulatory Visit: Payer: Self-pay | Admitting: Specialist

## 2019-05-13 DIAGNOSIS — M25561 Pain in right knee: Secondary | ICD-10-CM

## 2019-05-26 ENCOUNTER — Ambulatory Visit
Admission: EM | Admit: 2019-05-26 | Discharge: 2019-05-26 | Disposition: A | Discharge status: Home / Self Care | Payer: 59

## 2019-05-26 ENCOUNTER — Other Ambulatory Visit: Payer: Self-pay

## 2019-05-26 ENCOUNTER — Encounter: Payer: Self-pay | Admitting: Emergency Medicine

## 2019-05-26 DIAGNOSIS — L03011 Cellulitis of right finger: Secondary | ICD-10-CM | POA: Diagnosis not present

## 2019-05-26 MED ORDER — AMOXICILLIN-POT CLAVULANATE 875-125 MG PO TABS: 1.0000 | ORAL_TABLET | Freq: Two times a day (BID) | ORAL | 0 refills | Status: AC

## 2019-05-26 NOTE — Discharge Instructions (Addendum)
Keep area(s) clean and dry. °Apply hot compress / towel for 5-10 minutes 3-5 times daily. °Take antibiotic as prescribed with food - important to complete course. °Return for worsening pain, redness, swelling, discharge, fever. ° °Helpful prevention tips: °Keep nails short to avoid secondary skin infections. °Use new, clean razors when shaving. °Avoid antiperspirants - look for deodorants without aluminum. °Avoid wearing underwire bras as this can irritate the area further.  °

## 2019-05-26 NOTE — ED Provider Notes (Signed)
[facility]    CSN: [id number] Arrival date & time: 05/26/19  0907      History   Chief Complaint Chief Complaint  Patient presents with  . Hand Pain    HPI Kenneth Barron is a 54 y.o. male with history of uncontrolled diabetes, hypertension presenting for Right third finger pain and swelling.  States that he bites his nails and has had this before, though never this bad.  Patient did poke it with a lancet last night with some pain relief, though endorsing worsening pain.  Last tetanus in the last 2 years.  Denies fever, chills, arthralgias, myalgias.  Reports recent CBGs have been in the 150s.   Past Medical History:  Diagnosis Date  . Diabetes mellitus without complication (HCC)    Type 2  . Hypertension     There are no problems to display for this patient.   Past Surgical History:  Procedure Laterality Date  . HIP SURGERY     2 surgeries  . ROTATOR CUFF REPAIR Bilateral        Home Medications    Prior to Admission medications   Medication Sig Start Date End Date Taking? Authorizing Provider  amLODipine (NORVASC) 10 MG tablet Take 10 mg by mouth daily. 02/13/17  Yes [provider]  lisinopril-hydrochlorothiazide (PRINZIDE,ZESTORETIC) 20-25 MG tablet Take 1 tablet by mouth daily. 01/11/17  Yes [provider]  methocarbamol (ROBAXIN) 500 MG tablet Take 500 mg by mouth 3 (three) times daily as needed. 05/23/19  Yes [provider]  naproxen (NAPROSYN) 250 MG tablet Take by mouth 2 (two) times daily with a meal.   Yes [provider]  amoxicillin-clavulanate (AUGMENTIN) 875-125 MG tablet Take 1 tablet by mouth every 12 (twelve) hours for 5 days. 05/26/19 05/31/19  Hall-Potvin, [place], PA-C  atorvastatin (LIPITOR) 20 MG tablet Take 20 mg by mouth daily. 03/10/17   [provider]  diclofenac (VOLTAREN) 75 MG EC tablet Take 75 mg by mouth 2 (two) times daily. 01/31/17   [provider]  insulin  degludec (TRESIBA) 100 UNIT/ML SOPN FlexTouch Pen Inject 10 Units into the skin daily. 11/09/16 01/31/22  [provider]  metFORMIN (GLUCOPHAGE-XR) 500 MG 24 hr tablet Take 1,000 mg by mouth 2 (two) times daily. 03/10/17   [provider]  ondansetron (ZOFRAN) 4 MG tablet Take 1 tablet (4 mg total) by mouth every 6 (six) hours. 04/13/17   Law, Waylan Boga, PA-C  OZEMPIC, 0.25 OR 0.5 MG/DOSE, 2 MG/1.5ML SOPN SMARTSIG:0.38 Milliliter(s) SUB-Q Once a Week 05/21/19   [provider]  sertraline (ZOLOFT) 50 MG tablet Take 50 mg by mouth daily. 03/10/17   [provider]  TRESIBA FLEXTOUCH 200 UNIT/ML FlexTouch Pen INJECT 30 UNITS SUBCUTANEOUSLY INTO SKIN ONCE DAILY(CAN RAISE UP TO 60 UNITS A DAY) 05/21/19   [provider]    Family History Family History  Problem Relation Age of Onset  . Diabetes Mother   . Diabetes Father     Social History Social History   Tobacco Use  . Smoking status: Never Smoker  . Smokeless tobacco: Never Used  Substance Use Topics  . Alcohol use: Never  . Drug use: Yes    Types: Marijuana     Allergies   Patient has no known allergies.   Review of Systems As per HPI   Physical Exam Triage Vital Signs ED Triage Vitals  Enc Vitals Group     BP      Pulse  Resp      Temp      Temp src      SpO2      Weight      Height      Head Circumference      Peak Flow      Pain Score      Pain Loc      Pain Edu?      Excl. in [place]?    No data found.  Updated Vital Signs BP (!) 142/93 (BP Location: Left Arm) Comment (BP Location): large cuff  Pulse 74   Temp 98.5 F (36.9 C) (Oral)   Resp 20   SpO2 95%   Visual Acuity Right Eye Distance:   Left Eye Distance:   Bilateral Distance:    Right Eye Near:   Left Eye Near:    Bilateral Near:     Physical Exam Constitutional:      General: He is not in acute distress. HENT:     Head: Normocephalic and atraumatic.  Eyes:     General: No scleral  icterus.    Pupils: Pupils are equal, round, and reactive to light.  Cardiovascular:     Rate and Rhythm: Normal rate.  Pulmonary:     Effort: Pulmonary effort is normal. No respiratory distress.     Breath sounds: No wheezing.  Musculoskeletal:        General: Swelling and tenderness present. Normal range of motion.  Skin:    Coloration: Skin is not jaundiced or pale.     Comments: Paronychia noted to right hand, third digit  Neurological:     Mental Status: He is alert and oriented to person, place, and time.      UC Treatments / Results  Labs (all labs ordered are listed, but only abnormal results are displayed) Labs Reviewed - No data to display  EKG   Radiology No results found.  Procedures Incision and Drainage  Date/Time: 05/26/2019 10:04 AM Performed by: Quincy Sheehan, PA-C Authorized by: Quincy Sheehan, PA-C   Consent:    Consent obtained:  Verbal   Consent given by:  Patient   Risks discussed:  Bleeding, incomplete drainage, pain and damage to other organs   Alternatives discussed:  No treatment Universal protocol:    Patient identity confirmed:  Verbally with patient Location:    Indications for incision and drainage: paronychia.   Size:  1.5 cm   Location: right 3rd digit. Pre-procedure details:    Procedure prep: alcohol swabs. Anesthesia (see MAR for exact dosages):    Anesthesia method:  Nerve block   Block location:  Right 3rd MCP   Block needle gauge:  25 G   Block anesthetic:  Lidocaine 2% w/o epi   Block technique:  Digital   Block injection procedure:  Anatomic landmarks identified, anatomic landmarks palpated, negative aspiration for blood, introduced needle and incremental injection   Block outcome:  Anesthesia achieved Procedure type:    Complexity:  Simple Procedure details:    Needle aspiration: no     Incision types:  Stab incision   Incision depth:  Subcutaneous   Scalpel blade:  11   Drainage:  Purulent and  bloody   Drainage amount:  Copious   Wound treatment:  Wound left open   Packing materials:  None Post-procedure details:    Patient tolerance of procedure:  Tolerated well, no immediate complications   (including critical care time)  Medications Ordered in UC Medications - No data to display  Initial Impression / Assessment and Plan / UC Course  I have reviewed the triage vital signs and the nursing notes.  Pertinent labs & imaging results that were available during my care of the patient were reviewed by me and considered in my medical decision making (see chart for details).     Patient febrile, nontoxic in office today.  I&D performed successfully which patient tolerated well.  Given immunocompromise state second to diabetes and history of nail biting, will do short course of Augmentin and have patient follow-up with PCP in 1 week.  Return precautions discussed, patient verbalized understanding and is agreeable to plan. Final Clinical Impressions(s) / UC Diagnoses   Final diagnoses:  Paronychia of finger of right hand     Discharge Instructions     Keep area(s) clean and dry. Apply hot compress / towel for 5-10 minutes 3-5 times daily. Take antibiotic as prescribed with food - important to complete course. Return for worsening pain, redness, swelling, discharge, fever.  Helpful prevention tips: Keep nails short to avoid secondary skin infections. Use new, clean razors when shaving. Avoid antiperspirants - look for deodorants without aluminum. Avoid wearing underwire bras as this can irritate the area further.     ED Prescriptions    Medication Sig Dispense Auth. Provider   amoxicillin-clavulanate (AUGMENTIN) 875-125 MG tablet Take 1 tablet by mouth every 12 (twelve) hours for 5 days. 10 tablet Hall-Potvin, [place], PA-C     PDMP not reviewed this encounter.   Hall-Potvin, [place], [place] 05/26/19 1006

## 2019-05-26 NOTE — ED Triage Notes (Signed)
Noticed swelling to right middle finger on Friday.  Swelling and pain increased, pus at cuticle.  Patient did poke it last night and pain decreased, but this morning, right middle finger is just as painful.  Visible swelling, pus at base/edge of nail.  Patient does bit nails

## 2019-06-07 ENCOUNTER — Other Ambulatory Visit: Payer: 59

## 2019-10-17 ENCOUNTER — Ambulatory Visit (HOSPITAL_COMMUNITY)
Admission: EM | Admit: 2019-10-17 | Discharge: 2019-10-17 | Disposition: A | Discharge status: Home / Self Care | Payer: Self-pay

## 2019-10-17 ENCOUNTER — Encounter (HOSPITAL_COMMUNITY): Payer: Self-pay

## 2019-10-17 ENCOUNTER — Other Ambulatory Visit: Payer: Self-pay

## 2019-10-17 DIAGNOSIS — M25511 Pain in right shoulder: Secondary | ICD-10-CM

## 2019-10-17 MED ORDER — MELOXICAM 15 MG PO TABS: 15.0000 mg | ORAL_TABLET | Freq: Every day | ORAL | 0 refills | Status: AC

## 2019-10-17 MED ORDER — ACETAMINOPHEN 500 MG PO TABS: 1000.0000 mg | ORAL_TABLET | Freq: Three times a day (TID) | ORAL | 0 refills | Status: DC | PRN

## 2019-10-17 NOTE — Discharge Instructions (Signed)
Take mobic with food daily Take tylenol for additional relief  Use sling for comfort  Follow up with Dr. Jordan Likes Monday  Ice and rest this weekend

## 2019-10-17 NOTE — ED Provider Notes (Signed)
[facility]    CSN: [id number] Arrival date & time: 10/17/19  1418      History   Chief Complaint Chief Complaint  Patient presents with  . Shoulder Pain    HPI Kenneth Barron is a 54 y.o. male.   Patient with history of rotator cuff surgeries presents for right shoulder pain.  He reports he has been having issues with shoulder on and off for some time even since surgery.  He reports over the last few days he has had worsening sharp and aching pain.  He reports the pain was to the point where he thought he cannot go to work today.  He reports it began hurting a lot yesterday work but he got it through it.  He reports significant amount of pain with lifting objects to the side or in the front.  Describes pain in the front and back of the shoulder.  He states is very much like when he had rotator cuff issues in the past.  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  If laying on the right side he has less pain but is fingers and hand can go numb at times.  He denies any numbness or tingling outside of sleeping on the right side.  He has not had any direct traumas to the shoulder or falls.  Has follow-up with his orthopedist later this month, has not seen him for some time.     Past Medical History:  Diagnosis Date  . Diabetes mellitus without complication (HCC)    Type 2  . Hypertension     There are no problems to display for this patient.   Past Surgical History:  Procedure Laterality Date  . HIP SURGERY     2 surgeries  . ROTATOR CUFF REPAIR Bilateral        Home Medications    Prior to Admission medications   Medication Sig Start Date End Date Taking? Authorizing Provider  acetaminophen (TYLENOL) 500 MG tablet Take 2 tablets (1,000 mg total) by mouth every 8 (eight) hours as needed for moderate pain. 10/17/19   Michail Boyte, Veryl Speak, PA-C  amLODipine (NORVASC) 10 MG tablet Take 10 mg by mouth daily. 02/13/17    [provider]  atorvastatin (LIPITOR) 20 MG tablet Take 20 mg by mouth daily. 03/10/17   [provider]  diclofenac (VOLTAREN) 75 MG EC tablet Take 75 mg by mouth 2 (two) times daily. 01/31/17   [provider]  insulin degludec (TRESIBA) 100 UNIT/ML SOPN FlexTouch Pen Inject 10 Units into the skin daily. 11/09/16 01/31/22  [provider]  lisinopril-hydrochlorothiazide (PRINZIDE,ZESTORETIC) 20-25 MG tablet Take 1 tablet by mouth daily. 01/11/17   [provider]  meloxicam (MOBIC) 15 MG tablet Take 1 tablet (15 mg total) by mouth daily for 10 days. 10/17/19 10/27/19  Kengo Sturges, Veryl Speak, PA-C  metFORMIN (GLUCOPHAGE-XR) 500 MG 24 hr tablet Take 1,000 mg by mouth 2 (two) times daily. 03/10/17   [provider]  methocarbamol (ROBAXIN) 500 MG tablet Take 500 mg by mouth 3 (three) times daily as needed. 05/23/19   [provider]  metoprolol tartrate (LOPRESSOR) 25 MG tablet Take 25 mg by mouth 2 (two) times daily. 04/23/19   [provider]  ondansetron (ZOFRAN) 4 MG tablet Take 1 tablet (4 mg total) by mouth every 6 (six) hours. 04/13/17   Law, Alexandra M, PA-C  OZEMPIC, 0.25 OR 0.5  MG/DOSE, 2 MG/1.5ML SOPN SMARTSIG:0.38 Milliliter(s) SUB-Q Once a Week 05/21/19   [provider]  sertraline (ZOLOFT) 50 MG tablet Take 50 mg by mouth daily. 03/10/17   [provider]  TRESIBA FLEXTOUCH 200 UNIT/ML FlexTouch Pen INJECT 30 UNITS SUBCUTANEOUSLY INTO SKIN ONCE DAILY(CAN RAISE UP TO 60 UNITS A DAY) 05/21/19   [provider]    Family History Family History  Problem Relation Age of Onset  . Diabetes Mother   . Diabetes Father     Social History Social History   Tobacco Use  . Smoking status: Never Smoker  . Smokeless tobacco: Never Used  Substance Use Topics  . Alcohol use: Never  . Drug use: Yes    Types: Marijuana     Allergies   Patient has no known allergies.   Review of Systems Review of  Systems   Physical Exam Triage Vital Signs ED Triage Vitals [10/17/19 1533]  Enc Vitals Group     BP 139/85     Pulse Rate 67     Resp 16     Temp 98.6 F (37 C)     Temp Source Oral     SpO2 97 %     Weight 275 lb (124.7 kg)     Height 5\' 11"  (1.803 m)     Head Circumference      Peak Flow      Pain Score 6     Pain Loc      Pain Edu?      Excl. in [place]?    No data found.  Updated Vital Signs BP 139/85   Pulse 67   Temp 98.6 F (37 C) (Oral)   Resp 16   Ht 5\' 11"  (1.803 m)   Wt 275 lb (124.7 kg)   SpO2 97%   BMI 38.35 kg/m   Visual Acuity Right Eye Distance:   Left Eye Distance:   Bilateral Distance:    Right Eye Near:   Left Eye Near:    Bilateral Near:     Physical Exam Vitals and nursing note reviewed.  Constitutional:      Appearance: Normal appearance.  Musculoskeletal:     Comments: Right shoulder without obvious deformity.  No bony tenderness of the humerus, clavicle, acromium or coracoid.  Patient has full range of motion of the shoulder joint, pain with lowering arm above parallel.  There is posterior tenderness in the subacromial space.  There is also some anterior tenderness.  Pain with empty can.  Unable to place arm behind back.  No pain with cross arm test.  Some pain with resisted internal and external rotation.  Distal pulse 2+ sensation intact.  Good strength at the elbow and wrist.  Skin:    General: Skin is warm and dry.  Neurological:     Mental Status: He is alert.      UC Treatments / Results  Labs (all labs ordered are listed, but only abnormal results are displayed) Labs Reviewed - No data to display  EKG   Radiology No results found.  Procedures Procedures (including critical care time)  Medications Ordered in UC Medications - No data to display  Initial Impression / Assessment and Plan / UC Course  I have reviewed the triage vital signs and the nursing notes.  Pertinent labs & imaging results that were available  during my care of the patient were reviewed by me and considered in my medical decision making (see chart for details).     #  Right shoulder pain Patient 54 year old with history of rotator cuff issues presenting with right shoulder pain.  Differential would include rotator cuff injury.  Is neurovascularly intact.  Will treat with Mobic for now placed in a sling for comfort and have him follow-up with sports medicine for closer follow-up.  Patient is agreeable to this plan.  Work note given charged him to ice and rest over the weekend.  He verbalized understanding agreement plan of care Final Clinical Impressions(s) / UC Diagnoses   Final diagnoses:  Acute pain of right shoulder     Discharge Instructions     Take mobic with food daily Take tylenol for additional relief  Use sling for comfort  Follow up with Dr. Jordan Likes Monday  Ice and rest this weekend     ED Prescriptions    Medication Sig Dispense Auth. Provider   meloxicam (MOBIC) 15 MG tablet Take 1 tablet (15 mg total) by mouth daily for 10 days. 10 tablet Seidy Labreck, Veryl Speak, PA-C   acetaminophen (TYLENOL) 500 MG tablet Take 2 tablets (1,000 mg total) by mouth every 8 (eight) hours as needed for moderate pain. 30 tablet Corneilus Heggie, Veryl Speak, PA-C     PDMP not reviewed this encounter.   Hermelinda Medicus, PA-C 10/17/19 2312

## 2019-10-17 NOTE — ED Triage Notes (Signed)
Pt c/o 6/10 sharp pain in right shoulderx2 wks. Pt denies injury. PT states had surg in past for torn rotator cuff. Pt has tingling and numbness in arm and hand if he sleeps on that shoulder.

## 2019-10-27 ENCOUNTER — Other Ambulatory Visit: Payer: Self-pay | Admitting: Specialist

## 2019-10-27 DIAGNOSIS — M25511 Pain in right shoulder: Secondary | ICD-10-CM

## 2019-11-24 ENCOUNTER — Other Ambulatory Visit: Payer: Self-pay

## 2020-01-06 ENCOUNTER — Ambulatory Visit
Admission: EM | Admit: 2020-01-06 | Discharge: 2020-01-06 | Disposition: A | Discharge status: Home / Self Care | Payer: Federal, State, Local not specified - PPO | Attending: Emergency Medicine | Admitting: Emergency Medicine

## 2020-01-06 DIAGNOSIS — M542 Cervicalgia: Secondary | ICD-10-CM | POA: Diagnosis not present

## 2020-01-06 DIAGNOSIS — M62838 Other muscle spasm: Secondary | ICD-10-CM | POA: Diagnosis not present

## 2020-01-06 MED ORDER — MELOXICAM 7.5 MG PO TABS: 7.5000 mg | ORAL_TABLET | Freq: Every day | ORAL | 0 refills | Status: DC

## 2020-01-06 NOTE — ED Triage Notes (Addendum)
Pt c/o rt shoulder pain shooting up rt neck since Friday. Denies injury. States hx of rotator cuff repair. States took flexeril and robaxin with no relief.

## 2020-01-06 NOTE — ED Provider Notes (Signed)
[facility]    CSN: [id number] Arrival date & time: 01/06/20  1738      History   Chief Complaint Chief Complaint  Patient presents with  . Neck Pain    HPI Kenneth Barron is a 54 y.o. male  With history as below presenting for right neck pain and spasms that been going on intermittently since Friday.  States spasms will shoot up his neck.  Denies headache, change in vision or sensation.  Has tried Flexeril and Robaxin from previous rotator cuff injury without relief.  Has not taken any anti-inflammatories, used ice.  Has not consulted with his orthopedic provider, whom he sees for rotator cuff injuries.  Denies trauma, inciting event.  Past Medical History:  Diagnosis Date  . Diabetes mellitus without complication (HCC)    Type 2  . Hypertension     There are no problems to display for this patient.   Past Surgical History:  Procedure Laterality Date  . HIP SURGERY     2 surgeries  . ROTATOR CUFF REPAIR Bilateral        Home Medications    Prior to Admission medications   Medication Sig Start Date End Date Taking? Authorizing Provider  acetaminophen (TYLENOL) 500 MG tablet Take 2 tablets (1,000 mg total) by mouth every 8 (eight) hours as needed for moderate pain. 10/17/19   Darr, Gerilyn Pilgrim, PA-C  amLODipine (NORVASC) 10 MG tablet Take 10 mg by mouth daily. 02/13/17   [provider]  atorvastatin (LIPITOR) 20 MG tablet Take 20 mg by mouth daily. 03/10/17   [provider]  diclofenac (VOLTAREN) 75 MG EC tablet Take 75 mg by mouth 2 (two) times daily. 01/31/17   [provider]  insulin degludec (TRESIBA) 100 UNIT/ML SOPN FlexTouch Pen Inject 10 Units into the skin daily. 11/09/16 01/31/22  [provider]  lisinopril-hydrochlorothiazide (PRINZIDE,ZESTORETIC) 20-25 MG tablet Take 1 tablet by mouth daily. 01/11/17   [provider]  meloxicam (MOBIC) 7.5 MG tablet Take 1 tablet (7.5 mg total) by mouth daily.  01/06/20   Hall-Potvin, [place], PA-C  metFORMIN (GLUCOPHAGE-XR) 500 MG 24 hr tablet Take 1,000 mg by mouth 2 (two) times daily. 03/10/17   [provider]  methocarbamol (ROBAXIN) 500 MG tablet Take 500 mg by mouth 3 (three) times daily as needed. 05/23/19   [provider]  metoprolol tartrate (LOPRESSOR) 25 MG tablet Take 25 mg by mouth 2 (two) times daily. 04/23/19   [provider]  ondansetron (ZOFRAN) 4 MG tablet Take 1 tablet (4 mg total) by mouth every 6 (six) hours. 04/13/17   Law, Waylan Boga, PA-C  OZEMPIC, 0.25 OR 0.5 MG/DOSE, 2 MG/1.5ML SOPN SMARTSIG:0.38 Milliliter(s) SUB-Q Once a Week 05/21/19   [provider]  sertraline (ZOLOFT) 50 MG tablet Take 50 mg by mouth daily. 03/10/17   [provider]  TRESIBA FLEXTOUCH 200 UNIT/ML FlexTouch Pen INJECT 30 UNITS SUBCUTANEOUSLY INTO SKIN ONCE DAILY(CAN RAISE UP TO 60 UNITS A DAY) 05/21/19   [provider]    Family History Family History  Problem Relation Age of Onset  . Diabetes Mother   . Diabetes Father     Social History Social History   Tobacco Use  . Smoking status: Never Smoker  . Smokeless tobacco: Never Used  Substance Use Topics  . Alcohol use: Never  . Drug use: Yes    Types: Marijuana     Allergies   Patient has no known allergies.   Review of Systems  Review of Systems  Constitutional: Negative for fatigue and fever.  Respiratory: Negative for cough and shortness of breath.   Cardiovascular: Negative for chest pain and palpitations.  Gastrointestinal: Negative for abdominal pain, diarrhea and vomiting.  Musculoskeletal: Positive for neck pain. Negative for arthralgias, back pain, myalgias and neck stiffness.  Skin: Negative for rash and wound.  Neurological: Negative for speech difficulty and headaches.  All other systems reviewed and are negative.    Physical Exam Triage Vital Signs ED Triage Vitals  Enc Vitals Group     BP 01/06/20 1850 (!)  169/121     Pulse Rate 01/06/20 1850 (!) 53     Resp 01/06/20 1850 18     Temp 01/06/20 1850 98.6 F (37 C)     Temp Source 01/06/20 1850 Oral     SpO2 01/06/20 1850 94 %     Weight --      Height --      Head Circumference --      Peak Flow --      Pain Score 01/06/20 1851 6     Pain Loc --      Pain Edu? --      Excl. in GC? --    No data found.  Updated Vital Signs BP (!) 169/121 (BP Location: Right Arm)   Pulse (!) 53   Temp 98.6 F (37 C) (Oral)   Resp 18   SpO2 94%   Visual Acuity Right Eye Distance:   Left Eye Distance:   Bilateral Distance:    Right Eye Near:   Left Eye Near:    Bilateral Near:     Physical Exam Constitutional:      General: He is not in acute distress. HENT:     Head: Normocephalic and atraumatic.  Eyes:     General: No scleral icterus.    Pupils: Pupils are equal, round, and reactive to light.  Neck:      Comments: TTP without crepitus, edema.  NVI Cardiovascular:     Rate and Rhythm: Normal rate.  Pulmonary:     Effort: Pulmonary effort is normal. No respiratory distress.     Breath sounds: No wheezing.  Skin:    Coloration: Skin is not jaundiced or pale.  Neurological:     Mental Status: He is alert and oriented to person, place, and time.      UC Treatments / Results  Labs (all labs ordered are listed, but only abnormal results are displayed) Labs Reviewed - No data to display  EKG   Radiology No results found.  Procedures Procedures (including critical care time)  Medications Ordered in UC Medications - No data to display  Initial Impression / Assessment and Plan / UC Course  I have reviewed the triage vital signs and the nursing notes.  Pertinent labs & imaging results that were available during my care of the patient were reviewed by me and considered in my medical decision making (see chart for details).     H&P consistent with trapezius muscle spasm and MSK pain.  Could be related to previous rotator  cuff injury.  Low concern for acute process at this time.  Will trial anti-inflammatory, defer steroids as patient states he is able to get a "shoulder injection"from them.  Return precautions discussed, pt verbalized understanding and is agreeable to plan. Final Clinical Impressions(s) / UC Diagnoses   Final diagnoses:  Neck pain on right side  Trapezius muscle spasm   Discharge Instructions  None    ED Prescriptions    Medication Sig Dispense Auth. Provider   meloxicam (MOBIC) 7.5 MG tablet Take 1 tablet (7.5 mg total) by mouth daily. 14 tablet Hall-Potvin, [place], PA-C     I have reviewed the PDMP during this encounter.   Hall-Potvin, [place], [place] 01/06/20 2014

## 2020-01-12 ENCOUNTER — Encounter: Payer: Self-pay | Admitting: *Deleted

## 2020-01-12 ENCOUNTER — Ambulatory Visit (INDEPENDENT_AMBULATORY_CARE_PROVIDER_SITE_OTHER): Payer: Federal, State, Local not specified - PPO

## 2020-01-12 ENCOUNTER — Ambulatory Visit
Admission: EM | Admit: 2020-01-12 | Discharge: 2020-01-12 | Disposition: A | Discharge status: Home / Self Care | Payer: Federal, State, Local not specified - PPO | Attending: Emergency Medicine | Admitting: Emergency Medicine

## 2020-01-12 DIAGNOSIS — R0602 Shortness of breath: Secondary | ICD-10-CM | POA: Diagnosis not present

## 2020-01-12 DIAGNOSIS — R079 Chest pain, unspecified: Secondary | ICD-10-CM

## 2020-01-12 MED ORDER — ALUM & MAG HYDROXIDE-SIMETH 400-400-40 MG/5ML PO SUSP: 15.0000 mL | Freq: Four times a day (QID) | ORAL | 0 refills | Status: DC | PRN

## 2020-01-12 MED ORDER — OMEPRAZOLE 20 MG PO CPDR: 20.0000 mg | DELAYED_RELEASE_CAPSULE | Freq: Two times a day (BID) | ORAL | 0 refills | Status: DC

## 2020-01-12 MED ORDER — ALUM & MAG HYDROXIDE-SIMETH 200-200-20 MG/5ML PO SUSP
30.0000 mL | Freq: Once | ORAL | Status: AC
  Administered 2020-01-12: 30 mL via ORAL

## 2020-01-12 MED ORDER — LIDOCAINE VISCOUS HCL 2 % MT SOLN
15.0000 mL | Freq: Once | OROMUCOSAL | Status: AC
  Administered 2020-01-12: 15 mL via ORAL

## 2020-01-12 NOTE — ED Triage Notes (Addendum)
Patient reports sudden onset of sharp pain to the middle of the back while getting ready this am, reports shortness of breath with the pain. Reports diaphoresis. States that can feel in anterior chest but is mainly in back. Reports left arm numbness.   Diaphoretic in triage. Reports intermittent sharp pains.

## 2020-01-12 NOTE — ED Notes (Signed)
EKG completed at 1153, [place] PA notified

## 2020-01-12 NOTE — Discharge Instructions (Addendum)
Begin omeprazole twice daily for the next 2 weeks May supplement with Maalox or Pepcid Please go to the emergency room if any symptoms progressing or worsening/changing

## 2020-01-12 NOTE — ED Provider Notes (Signed)
[facility]    CSN: [id number] Arrival date & time: 01/12/20  1140      History   Chief Complaint Chief Complaint  Patient presents with  . Back Pain  . Shortness of Breath    HPI Janathan Bribiesca is a 54 y.o. male history of hypertension, DM type II, presenting today for evaluation of back and chest pain.  Patient reports that earlier today he had sudden onset of sharp pain in his left back radiating to his chest.  Symptoms have been constant, but waxing and waning in intensity.  Worsens with inspiration.  Denies any recent cough fevers.  Denies prior DVT/PE.  Denies leg pain or leg swelling.  Denies any recent travel or immobilization.  Denies any tobacco use.  HPI  Past Medical History:  Diagnosis Date  . Diabetes mellitus without complication (HCC)    Type 2  . Hypertension     There are no problems to display for this patient.   Past Surgical History:  Procedure Laterality Date  . HIP SURGERY     2 surgeries  . ROTATOR CUFF REPAIR Bilateral        Home Medications    Prior to Admission medications   Medication Sig Start Date End Date Taking? Authorizing Provider  acetaminophen (TYLENOL) 500 MG tablet Take 2 tablets (1,000 mg total) by mouth every 8 (eight) hours as needed for moderate pain. 10/17/19   Darr, Gerilyn Pilgrim, PA-C  alum & mag hydroxide-simeth (MAALOX PLUS) 400-400-40 MG/5ML suspension Take 15 mLs by mouth every 6 (six) hours as needed for indigestion. 01/12/20   Davanee Klinkner C, PA-C  amLODipine (NORVASC) 10 MG tablet Take 10 mg by mouth daily. 02/13/17   [provider]  atorvastatin (LIPITOR) 20 MG tablet Take 20 mg by mouth daily. 03/10/17   [provider]  diclofenac (VOLTAREN) 75 MG EC tablet Take 75 mg by mouth 2 (two) times daily. 01/31/17   [provider]  insulin degludec (TRESIBA) 100 UNIT/ML SOPN FlexTouch Pen Inject 10 Units into the skin daily. 11/09/16 01/31/22  [provider]   lisinopril-hydrochlorothiazide (PRINZIDE,ZESTORETIC) 20-25 MG tablet Take 1 tablet by mouth daily. 01/11/17   [provider]  meloxicam (MOBIC) 7.5 MG tablet Take 1 tablet (7.5 mg total) by mouth daily. 01/06/20   Hall-Potvin, [place], PA-C  metFORMIN (GLUCOPHAGE-XR) 500 MG 24 hr tablet Take 1,000 mg by mouth 2 (two) times daily. 03/10/17   [provider]  methocarbamol (ROBAXIN) 500 MG tablet Take 500 mg by mouth 3 (three) times daily as needed. 05/23/19   [provider]  metoprolol tartrate (LOPRESSOR) 25 MG tablet Take 25 mg by mouth 2 (two) times daily. 04/23/19   [provider]  omeprazole (PRILOSEC) 20 MG capsule Take 1 capsule (20 mg total) by mouth 2 (two) times daily before a meal for 15 days. 01/12/20 01/27/20  Pluma Diniz C, PA-C  ondansetron (ZOFRAN) 4 MG tablet Take 1 tablet (4 mg total) by mouth every 6 (six) hours. 04/13/17   Law, Waylan Boga, PA-C  OZEMPIC, 0.25 OR 0.5 MG/DOSE, 2 MG/1.5ML SOPN SMARTSIG:0.38 Milliliter(s) SUB-Q Once a Week 05/21/19   [provider]  sertraline (ZOLOFT) 50 MG tablet Take 50 mg by mouth daily. 03/10/17   [provider]  TRESIBA FLEXTOUCH 200 UNIT/ML FlexTouch Pen INJECT 30 UNITS SUBCUTANEOUSLY INTO SKIN ONCE DAILY(CAN RAISE UP TO 60 UNITS A DAY) 05/21/19   [provider]    Family History Family History  Problem Relation  Age of Onset  . Diabetes Mother   . Diabetes Father     Social History Social History   Tobacco Use  . Smoking status: Never Smoker  . Smokeless tobacco: Never Used  Substance Use Topics  . Alcohol use: Never  . Drug use: Yes    Types: Marijuana     Allergies   Patient has no known allergies.   Review of Systems Review of Systems  Constitutional: Negative for fatigue and fever.  HENT: Negative for congestion, sinus pressure and sore throat.   Eyes: Negative for photophobia, pain and visual disturbance.  Respiratory: Negative for cough and shortness  of breath.   Cardiovascular: Positive for chest pain.  Gastrointestinal: Negative for abdominal pain, nausea and vomiting.  Genitourinary: Negative for decreased urine volume and hematuria.  Musculoskeletal: Positive for back pain. Negative for myalgias, neck pain and neck stiffness.  Neurological: Negative for dizziness, syncope, facial asymmetry, speech difficulty, weakness, light-headedness, numbness and headaches.     Physical Exam Triage Vital Signs ED Triage Vitals  Enc Vitals Group     BP 01/12/20 1153 125/88     Pulse Rate 01/12/20 1153 (!) 59     Resp 01/12/20 1153 19     Temp 01/12/20 1153 98 F (36.7 C)     Temp Source 01/12/20 1153 Oral     SpO2 01/12/20 1153 97 %     Weight --      Height --      Head Circumference --      Peak Flow --      Pain Score 01/12/20 1148 10     Pain Loc --      Pain Edu? --      Excl. in GC? --    No data found.  Updated Vital Signs BP 125/88 (BP Location: Left Arm)   Pulse (!) 59   Temp 98 F (36.7 C) (Oral)   Resp 19   SpO2 97%   Visual Acuity Right Eye Distance:   Left Eye Distance:   Bilateral Distance:    Right Eye Near:   Left Eye Near:    Bilateral Near:     Physical Exam Vitals and nursing note reviewed.  Constitutional:      Appearance: He is well-developed.     Comments: Diaphoretic, intermittent sharp stabbing pains making patient more uncomfortable  HENT:     Head: Normocephalic and atraumatic.     Nose: Nose normal.     Mouth/Throat:     Comments: Oral mucosa pink and moist, no tonsillar enlargement or exudate. Posterior pharynx patent and nonerythematous, no uvula deviation or swelling. Normal phonation. Eyes:     Conjunctiva/sclera: Conjunctivae normal.  Cardiovascular:     Rate and Rhythm: Normal rate and regular rhythm.  Pulmonary:     Effort: Pulmonary effort is normal. No respiratory distress.     Comments: Breathing comfortably at rest, CTABL, no wheezing, rales or other adventitious sounds  auscultated  Abdominal:     General: There is no distension.  Musculoskeletal:        General: Normal range of motion.     Cervical back: Neck supple.     Comments: No reproducible tenderness to back or chest  Skin:    General: Skin is warm and dry.  Neurological:     Mental Status: He is alert and oriented to person, place, and time.      UC Treatments / Results  Labs (all labs ordered are listed, but  only abnormal results are displayed) Labs Reviewed - No data to display  EKG   Radiology DG Chest 2 View  Result Date: 01/12/2020 CLINICAL DATA:  LEFT chest pain shown the shortness of breath and stabbing abdominal pain to the middle of back which radiates to sternal area EXAM: CHEST - 2 VIEW COMPARISON:  No recent comparisons. FINDINGS: Trachea midline. Cardiomediastinal contours and hilar structures are normal. Lungs are clear. No sign of pleural effusion. On limited assessment no acute skeletal process. IMPRESSION: No acute cardiopulmonary disease. Electronically Signed   By: Donzetta Kohut M.D.   On: 01/12/2020 12:29    Procedures Procedures (including critical care time)  Medications Ordered in UC Medications  alum & mag hydroxide-simeth (MAALOX/MYLANTA) 200-200-20 MG/5ML suspension 30 mL (30 mLs Oral Given 01/12/20 1214)    And  lidocaine (XYLOCAINE) 2 % viscous mouth solution 15 mL (15 mLs Oral Given 01/12/20 1214)    Initial Impression / Assessment and Plan / UC Course  I have reviewed the triage vital signs and the nursing notes.  Pertinent labs & imaging results that were available during my care of the patient were reviewed by me and considered in my medical decision making (see chart for details).     EKG normal sinus rhythm, no acute signs of ischemia or infarction.  Initially discussed with patient further evaluation work-up to further rule out any cardiac etiology/PE given severity of pain and acute onset.  Patient wished to do trial of GI cocktail first,  chest x-ray was also obtained and was unremarkable.  Patient did have improvement in severity of symptoms with GI cocktail.  We will proceed with treatment for underlying GERD/gas.  Stressed importance of continuing to monitor symptoms over the next 24 to 48 hours and follow-up in emergency room if any symptoms not improving or worsening.  Discussed strict return precautions. Patient verbalized understanding and is agreeable with plan.  Final Clinical Impressions(s) / UC Diagnoses   Final diagnoses:  Chest pain, unspecified type     Discharge Instructions     Begin omeprazole twice daily for the next 2 weeks May supplement with Maalox or Pepcid Please go to the emergency room if any symptoms progressing or worsening/changing      ED Prescriptions    Medication Sig Dispense Auth. Provider   omeprazole (PRILOSEC) 20 MG capsule Take 1 capsule (20 mg total) by mouth 2 (two) times daily before a meal for 15 days. 30 capsule Melonie Germani C, PA-C   alum & mag hydroxide-simeth (MAALOX PLUS) 400-400-40 MG/5ML suspension Take 15 mLs by mouth every 6 (six) hours as needed for indigestion. 355 mL Haadiya Frogge, Ramona C, PA-C     PDMP not reviewed this encounter.   Lew Dawes, PA-C 01/12/20 1313

## 2020-01-16 ENCOUNTER — Other Ambulatory Visit: Payer: Self-pay | Admitting: Orthopedic Surgery

## 2020-01-16 DIAGNOSIS — M542 Cervicalgia: Secondary | ICD-10-CM

## 2020-02-13 ENCOUNTER — Inpatient Hospital Stay: Admission: RE | Admit: 2020-02-13 | Payer: Federal, State, Local not specified - PPO | Source: Ambulatory Visit

## 2020-04-27 ENCOUNTER — Encounter (HOSPITAL_COMMUNITY): Payer: Self-pay

## 2020-04-27 ENCOUNTER — Emergency Department (HOSPITAL_COMMUNITY): Payer: Federal, State, Local not specified - PPO

## 2020-04-27 ENCOUNTER — Emergency Department (HOSPITAL_COMMUNITY)
Admission: EM | Admit: 2020-04-27 | Discharge: 2020-04-27 | Disposition: A | Discharge status: Home / Self Care | Payer: Federal, State, Local not specified - PPO | Attending: Emergency Medicine | Admitting: Emergency Medicine

## 2020-04-27 DIAGNOSIS — R42 Dizziness and giddiness: Secondary | ICD-10-CM | POA: Insufficient documentation

## 2020-04-27 DIAGNOSIS — E119 Type 2 diabetes mellitus without complications: Secondary | ICD-10-CM | POA: Insufficient documentation

## 2020-04-27 DIAGNOSIS — R0602 Shortness of breath: Secondary | ICD-10-CM | POA: Diagnosis not present

## 2020-04-27 DIAGNOSIS — I10 Essential (primary) hypertension: Secondary | ICD-10-CM | POA: Diagnosis not present

## 2020-04-27 DIAGNOSIS — R55 Syncope and collapse: Secondary | ICD-10-CM | POA: Diagnosis present

## 2020-04-27 LAB — COMPREHENSIVE METABOLIC PANEL
ALT: 18 U/L (ref 0–44)
AST: 29 U/L (ref 15–41)
Albumin: 4.6 g/dL (ref 3.5–5.0)
Alkaline Phosphatase: 102 U/L (ref 38–126)
Anion gap: 15 (ref 5–15)
BUN: 30 mg/dL — ABNORMAL HIGH (ref 6–20)
CO2: 15 mmol/L — ABNORMAL LOW (ref 22–32)
Calcium: 9.3 mg/dL (ref 8.9–10.3)
Chloride: 101 mmol/L (ref 98–111)
Creatinine, Ser: 1.3 mg/dL — ABNORMAL HIGH (ref 0.61–1.24)
GFR, Estimated: >60 mL/min (ref >60)
Glucose, Bld: 176 mg/dL — ABNORMAL HIGH (ref 70–99)
Potassium: 4 mmol/L (ref 3.5–5.1)
Sodium: 131 mmol/L — ABNORMAL LOW (ref 135–145)
Total Bilirubin: 1.2 mg/dL (ref 0.3–1.2)
Total Protein: 7.5 g/dL (ref 6.5–8.1)

## 2020-04-27 LAB — CBC WITH DIFFERENTIAL/PLATELET
Abs Immature Granulocytes: 0.05 10*3/uL (ref 0.00–0.07)
Basophils Absolute: 0 10*3/uL (ref 0.0–0.1)
Basophils Relative: 0 %
Eosinophils Absolute: 0 10*3/uL (ref 0.0–0.5)
Eosinophils Relative: 0 %
HCT: 46.7 % (ref 39.0–52.0)
Hemoglobin: 14.9 g/dL (ref 13.0–17.0)
Immature Granulocytes: 1 %
Lymphocytes Relative: 17 %
Lymphs Abs: 1.8 10*3/uL (ref 0.7–4.0)
MCH: 31.7 pg (ref 26.0–34.0)
MCHC: 31.9 g/dL (ref 30.0–36.0)
MCV: 99.4 fL (ref 80.0–100.0)
Monocytes Absolute: 0.6 10*3/uL (ref 0.1–1.0)
Monocytes Relative: 5 %
Neutro Abs: 8.3 10*3/uL — ABNORMAL HIGH (ref 1.7–7.7)
Neutrophils Relative %: 77 %
Platelets: 307 10*3/uL (ref 150–400)
RBC: 4.7 MIL/uL (ref 4.22–5.81)
RDW: 13.1 % (ref 11.5–15.5)
WBC: 10.8 10*3/uL — ABNORMAL HIGH (ref 4.0–10.5)
nRBC: 0 % (ref 0.0–0.2)

## 2020-04-27 LAB — TROPONIN I (HIGH SENSITIVITY)
Troponin I (High Sensitivity): 4 ng/L (ref <18)
Troponin I (High Sensitivity): 4 ng/L (ref <18)

## 2020-04-27 MED ORDER — SODIUM CHLORIDE 0.9 % IV BOLUS
500.0000 mL | Freq: Once | INTRAVENOUS | Status: AC
  Administered 2020-04-27: 500 mL via INTRAVENOUS

## 2020-04-27 NOTE — Discharge Instructions (Signed)
You have been seen today in the Emergency Department (ED)  for syncope (passing out).  Your workup including labs and EKG show reassuring results.   ? ?Please call your regular doctor as soon as possible to schedule the next available clinic appointment to follow up with him/her regarding your visit to the ED and your symptoms.  Return to the Emergency Department (ED)  if you have any further syncopal episodes (pass out again) or develop ANY chest pain, pressure, tightness, trouble breathing, sudden sweating, or other symptoms that concern you. ? ?

## 2020-04-27 NOTE — ED Provider Notes (Signed)
Emergency Department Provider Note   I have reviewed the triage vital signs and the nursing notes.   HISTORY  Chief Complaint Loss of Consciousness   HPI Kenneth Barron is a 55 y.o. male with past medical history of diabetes, hypertension, elevated BMI presents to the emergency department for evaluation of syncope at work today.  Patient was standing and doing his normal job when he suddenly felt very hot all over and lightheaded.  He thought his blood sugar might be low and a coworker brought him some [employer] to drink but became more lightheaded.  He denies any chest pain or heart palpitations.  He did have some mild shortness of breath.  Coworkers sat him down but did not note that he did go unresponsive for an unknown period of time.  EMS arrived and report finding the patient cool and clammy and extremities.  He gradually awoke and states now he is feeling some fatigue but no other severe symptoms. No radiation of symptoms or modifying factors.   Past Medical History:  Diagnosis Date  . Diabetes mellitus without complication (HCC)    Type 2  . Hypertension     There are no problems to display for this patient.   Past Surgical History:  Procedure Laterality Date  . HIP SURGERY     2 surgeries  . ROTATOR CUFF REPAIR Bilateral     Allergies Patient has no known allergies.  Family History  Problem Relation Age of Onset  . Diabetes Mother   . Diabetes Father     Social History Social History   Tobacco Use  . Smoking status: Never Smoker  . Smokeless tobacco: Never Used  Substance Use Topics  . Alcohol use: Never  . Drug use: Yes    Types: Marijuana    Review of Systems  Constitutional: No fever/chills. Positive flushed feeling prior to syncope.  Eyes: No visual changes. ENT: No sore throat. Cardiovascular: Denies chest pain. Positive syncope.  Respiratory: Transient shortness of breath. Gastrointestinal: No abdominal pain.  No nausea, no  vomiting.  No diarrhea.  No constipation. Genitourinary: Negative for dysuria. Musculoskeletal: Negative for back pain. Skin: Negative for rash. Neurological: Negative for headaches, focal weakness or numbness.  10-point ROS otherwise negative.  ____________________________________________   PHYSICAL EXAM:  VITAL SIGNS: Vitals:   04/27/20 2215 04/27/20 2230  BP: (!) 155/79 (!) 169/97  Pulse: 61 (!) 58  Resp: 19 18  SpO2: 98% 100%     Constitutional: Alert and oriented. Well appearing and in no acute distress. Eyes: Conjunctivae are normal.  Head: Atraumatic. Nose: No congestion/rhinnorhea. Mouth/Throat: Mucous membranes are moist. Neck: No stridor.   Cardiovascular: Normal rate, regular rhythm. Good peripheral circulation. Grossly normal heart sounds.   Respiratory: Normal respiratory effort.  No retractions. Lungs CTAB. Gastrointestinal: Soft and nontender. No distention.  Musculoskeletal: No lower extremity tenderness nor edema. No gross deformities of extremities. Neurologic:  Normal speech and language. No gross focal neurologic deficits are appreciated.  Skin:  Skin is warm, dry and intact. No rash noted.   ____________________________________________   LABS (all labs ordered are listed, but only abnormal results are displayed)  Labs Reviewed  COMPREHENSIVE METABOLIC PANEL - Abnormal; Notable for the following components:      Result Value   Sodium 131 (*)    CO2 15 (*)    Glucose, Bld 176 (*)    BUN 30 (*)    Creatinine, Ser 1.30 (*)    All other components within normal  limits  CBC WITH DIFFERENTIAL/PLATELET - Abnormal; Notable for the following components:   WBC 10.8 (*)    Neutro Abs 8.3 (*)    All other components within normal limits  TROPONIN I (HIGH SENSITIVITY)  TROPONIN I (HIGH SENSITIVITY)   ____________________________________________  EKG   EKG Interpretation  Date/Time:  Tuesday April 27 2020 20:11:44 EDT Ventricular Rate:  53 PR  Interval:    QRS Duration: 107 QT Interval:  417 QTC Calculation: 392 R Axis:   63 Text Interpretation: Sinus rhythm Short PR interval Anteroseptal infarct, old Borderline repolarization abnormality Artifact noted. Will repeat. Confirmed by Alona Bene [phone]) on 04/27/2020 8:21:11 PM       ____________________________________________  RADIOLOGY  DG Chest Portable 1 View  Result Date: 04/27/2020 CLINICAL DATA:  Syncope EXAM: PORTABLE CHEST 1 VIEW COMPARISON:  01/12/2020 FINDINGS: Heart is normal size. Increased markings in the lung bases could reflect atelectasis or scarring. No effusions. No acute bony abnormality. IMPRESSION: Bibasilar scarring or atelectasis. Electronically Signed   By: Charlett Nose M.D.   On: 04/27/2020 22:23    ____________________________________________   PROCEDURES  Procedure(s) performed:   Procedures  None ____________________________________________   INITIAL IMPRESSION / ASSESSMENT AND PLAN / ED COURSE  Pertinent labs & imaging results that were available during my care of the patient were reviewed by me and considered in my medical decision making (see chart for details).   Patient presents to the emergency department for evaluation of syncope event at work today.  His EKG is similar to prior tracings but some artifact in the initial tracing.  There is some ST segment changes both inferior and lateral that do seem present on prior tracings but will repeat the tracing here and reassess.  No active symptoms.  Patient did feel flushed and hot with some mild nausea which seems mostly consistent with vasovagal type event.  No clear inciting event.  Patient does have some medical comorbidities which would increase his risk of cardiogenic syncope.  Plan for lab work, cardiac monitoring, chest x-ray, reassess. Patient does mention not eating much today. Question hypoglycemia. CBG normal with EMS but patient had been given [facility] prior to EMS arrival.   Labs  are reassuring. Troponin negative x 1. Reviewed case and EKG with Cardiology, Dr. Joyce Gross. Changes described seem consistent with LVH. Similar to prior. Second troponin in process. No abnormality on telemetry. Plan for discharge with close Cardiology and PCP follow up if troponin remains WNL.   Repeat troponin negative. Plan for discharge home. Discussed results and ED return precautions along with follow up plan. Patient pleased at the time of discharge.  ____________________________________________  FINAL CLINICAL IMPRESSION(S) / ED DIAGNOSES  Final diagnoses:  Syncope and collapse    MEDICATIONS GIVEN DURING THIS VISIT:  Medications  sodium chloride 0.9 % bolus 500 mL (0 mLs Intravenous Stopped 04/27/20 2245)    Note:  This document was prepared using Dragon voice recognition software and may include unintentional dictation errors.  Alona Bene, MD, [facility] Emergency Medicine    Shadrack Brummitt, Arlyss Repress, MD 04/27/20 [phone]

## 2020-04-27 NOTE — ED Triage Notes (Signed)
Pt BIBEMS. Pt was at work when he had a syncopal episode and was unresponsive. With ems pt cool and clammy. Pt denies CP/SOB. Pt is alert and oriented x4. Pt states he feels weaker than normal.

## 2020-04-30 ENCOUNTER — Other Ambulatory Visit: Payer: Self-pay

## 2020-04-30 ENCOUNTER — Ambulatory Visit (INDEPENDENT_AMBULATORY_CARE_PROVIDER_SITE_OTHER): Payer: Federal, State, Local not specified - PPO | Admitting: Internal Medicine

## 2020-04-30 VITALS — BP 118/82 | HR 52 | Ht 72.0 in | Wt 302.0 lb

## 2020-04-30 DIAGNOSIS — I10 Essential (primary) hypertension: Secondary | ICD-10-CM | POA: Diagnosis not present

## 2020-04-30 DIAGNOSIS — R55 Syncope and collapse: Secondary | ICD-10-CM

## 2020-04-30 DIAGNOSIS — E119 Type 2 diabetes mellitus without complications: Secondary | ICD-10-CM | POA: Diagnosis not present

## 2020-04-30 DIAGNOSIS — E782 Mixed hyperlipidemia: Secondary | ICD-10-CM | POA: Diagnosis not present

## 2020-04-30 NOTE — Patient Instructions (Signed)
Medication Instructions:  Your physician recommends that you continue on your current medications as directed. Please refer to the Current Medication list given to you today.  *If you need a refill on your cardiac medications before your next appointment, please call your pharmacy*   Testing/Procedures: Your physician has requested that you have an echocardiogram. Echocardiography is a painless test that uses sound waves to create images of your heart. It provides your doctor with information about the size and shape of your heart and how well your heart's chambers and valves are working. This procedure takes approximately one hour. There are no restrictions for this procedure. -- [address] - [address]rd Floor   Follow-Up: At [facility], you and your health needs are our priority.  As part of our continuing mission to provide you with exceptional heart care, we have created designated Provider Care Teams.  These Care Teams include your primary Cardiologist (physician) and Advanced Practice Providers (APPs -  Physician Assistants and Nurse Practitioners) who all work together to provide you with the care you need, when you need it.  We recommend signing up for the patient portal called "MyChart".  Sign up information is provided on this After Visit Summary.  MyChart is used to connect with patients for Virtual Visits (Telemedicine).  Patients are able to view lab/test results, encounter notes, upcoming appointments, etc.  Non-urgent messages can be sent to your provider as well.   To learn more about what you can do with MyChart, go to [email or web address].    Your next appointment:   AS NEEDED with Dr. Rennis Golden

## 2020-04-30 NOTE — Progress Notes (Signed)
OFFICE CONSULT NOTE  Chief Complaint:  Syncope, LVH  Primary Care Physician: Malka So., MD  HPI:  Kenneth Barron is a 55 y.o. male who is being seen today for the evaluation of syncope at the request of Long, Arlyss Repress, MD. 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  He is type II diabetic on insulin with a history of hypertension.  There is a family history of diabetes.  Unfortunately suffered a syncopal episode and was taken to emergency department on March 22.  He said he was working and he felt hot all over.  He thought it was similar to when he had had a previous hypoglycemic episode.  He asked a coworker to get him some candy and ultimately got some [facility] but had then passed out.  He was unresponsive for a short period of time.  When EMS got there his blood sugar was elevated.  He was taken to the emergency department.  An EKG was performed which showed some likely LVH by voltage.  There seem to be a clear prodrome to the event and it was thought to have either vasovagal syncope or perhaps a hypoglycemic episode.  He does report he had recent worsening of numbness and tingling in his extremities as well as cold and hot sensations which sound a lot like diabetic polyneuropathy.  PMHx:  Past Medical History:  Diagnosis Date  . Diabetes mellitus without complication (HCC)    Type 2  . Hypertension     Past Surgical History:  Procedure Laterality Date  . HIP SURGERY     2 surgeries  . ROTATOR CUFF REPAIR Bilateral     FAMHx:  Family History  Problem Relation Age of Onset  . Diabetes Mother   . Diabetes Father     SOCHx:   reports that he has never smoked. He has never used smokeless tobacco. He reports current drug use. Drug: Marijuana. He reports that he does not drink alcohol.  ALLERGIES:  No Known Allergies  ROS: Pertinent items noted in HPI and remainder of comprehensive ROS otherwise negative.  HOME  MEDS: Current Outpatient Medications on File Prior to Visit  Medication Sig Dispense Refill  . alum & mag hydroxide-simeth (MAALOX PLUS) 400-400-40 MG/5ML suspension Take 15 mLs by mouth every 6 (six) hours as needed for indigestion. 355 mL 0  . amLODipine (NORVASC) 10 MG tablet Take 10 mg by mouth daily.  2  . atorvastatin (LIPITOR) 20 MG tablet Take 20 mg by mouth daily.    . insulin degludec (TRESIBA) 100 UNIT/ML SOPN FlexTouch Pen Inject 10 Units into the skin daily.    Marland Kitchen lisinopril-hydrochlorothiazide (PRINZIDE,ZESTORETIC) 20-25 MG tablet Take 1 tablet by mouth daily.  1  . methocarbamol (ROBAXIN) 500 MG tablet Take 500 mg by mouth 3 (three) times daily as needed.    . metoprolol tartrate (LOPRESSOR) 25 MG tablet Take 25 mg by mouth 2 (two) times daily.    . sertraline (ZOLOFT) 50 MG tablet Take 50 mg by mouth daily.    . TRESIBA FLEXTOUCH 200 UNIT/ML FlexTouch Pen INJECT 30 UNITS SUBCUTANEOUSLY INTO SKIN ONCE DAILY(CAN RAISE UP TO 60 UNITS A DAY)     No current facility-administered medications on file prior to visit.    LABS/IMAGING: No results found for this or any previous visit (from the past 48 hour(s)). No results found.  LIPID PANEL: No results found for: CHOL, TRIG, HDL, CHOLHDL, VLDL, LDLCALC, LDLDIRECT  WEIGHTS: Wt Readings from Last 3 Encounters:  04/30/20 (!) 302 lb (137 kg)  10/17/19 275 lb (124.7 kg)  05/10/19 300 lb (136.1 kg)    VITALS: BP 118/82 (BP Location: Left Arm, Patient Position: Sitting, Cuff Size: Large)   Pulse (!) 52   Ht 6' (1.829 m)   Wt (!) 302 lb (137 kg)   BMI 40.96 kg/m   EXAM: General appearance: alert, no distress and morbidly obese Neck: no carotid bruit, no JVD and thyroid not enlarged, symmetric, no tenderness/mass/nodules Lungs: clear to auscultation bilaterally Heart: regular rate and rhythm Abdomen: soft, non-tender; bowel sounds normal; no masses,  no organomegaly Extremities: extremities normal, atraumatic, no cyanosis or  edema Pulses: 2+ and symmetric Skin: Skin color, texture, turgor normal. No rashes or lesions Neurologic: Grossly normal Psych: Pleasant  EKG: Sinus bradycardia at 52, normal voltage- personally reviewed  ASSESSMENT: 1. Syncope -possible vasovagal versus hypoglycemic event 2. Diabetes type 2 on insulin 3. Hypertension-controlled 4. Morbid obesity 5. EKG with voltage criteria for LVH  PLAN: 1.   Kenneth Barron had a syncopal event with a clear prodrome which very well could have been related to hypoglycemia or possibly a vasovagal event.  He is describing what sounds like possible diabetic polyneuropathy.  He has had type 2 diabetes on insulin now probably close to 10 years.  He has hypertension which appears to be well controlled on 3 agents.  He is morbidly obese with a BMI of 41.  EKG in the hospital showed voltage criteria for LVH although not a significant here today.  I would like to get an echo because of the syncope to rule out any structural heart disease and evaluate for possible causes of that event.  I have encouraged him to follow-up with his PCP which is scheduled soon and he probably should have more formal peripheral neurologic testing due to those symptoms.  If there are significant abnormalities to his echo, I would be happy to see him back on an annual basis otherwise we will contact him with those results and follow-up as needed.  Thanks for the kind referral  Chrystie Nose, MD, [facility]  [place]  [facility]  Medical Director of the [facility] Lipid [facility]  Cardiovascular Risk Reduction Clinic Diplomate of the American Board of Clinical Lipidology Attending Cardiologist  Direct Dial: [phone]  Fax: [phone]  Website:  [email or web address] 04/30/2020, 12:39 PM

## 2020-06-02 ENCOUNTER — Other Ambulatory Visit (HOSPITAL_COMMUNITY): Payer: Federal, State, Local not specified - PPO

## 2020-06-23 ENCOUNTER — Ambulatory Visit: Payer: Self-pay | Admitting: Orthopedic Surgery

## 2020-07-01 NOTE — Patient Instructions (Addendum)
DUE TO COVID-19 ONLY ONE VISITOR IS ALLOWED TO COME WITH YOU AND STAY IN THE WAITING ROOM ONLY DURING PRE OP AND PROCEDURE DAY OF SURGERY. THE 1 VISITOR  MAY VISIT WITH YOU AFTER SURGERY IN YOUR PRIVATE ROOM DURING VISITING HOURS ONLY!  YOU NEED TO HAVE A COVID 19 TEST ON: 07/12/20 @ 1:00 PM , THIS TEST MUST BE DONE BEFORE SURGERY,  COVID TESTING SITE [address] [place] [id number], IT IS ON THE RIGHT GOING OUT [address] APPROXIMATELY  2 MINUTES PAST [employer] ON THE RIGHT. ONCE YOUR COVID TEST IS COMPLETED,  PLEASE BEGIN THE QUARANTINE INSTRUCTIONS AS OUTLINED IN YOUR HANDOUT.                Kenneth Barron    Your procedure is scheduled on: 07/14/20   Report to [facility] Main  Entrance   Report to admitting at: 8:00 AM     Call this number if you have problems the morning of surgery [phone]    Remember: NO SOLID FOOD AFTER MIDNIGHT THE NIGHT PRIOR TO SURGERY. NOTHING BY MOUTH EXCEPT CLEAR LIQUIDS UNTIL: 7:00 AM . PLEASE FINISH GATORADE DRINK PER SURGEON ORDER  WHICH NEEDS TO BE COMPLETED AT: 7:00 AM .  CLEAR LIQUID DIET  Foods Allowed                                                                     Foods Excluded  Coffee and tea, regular and decaf                             liquids that you cannot  Plain Jell-O any favor except red or purple                                           see through such as: Fruit ices (not with fruit pulp)                                     milk, soups, orange juice  Iced Popsicles                                    All solid food Carbonated beverages, regular and diet                                    Cranberry, grape and apple juices Sports drinks like Gatorade Lightly seasoned clear broth or consume(fat free) Sugar, honey syrup  Sample Menu Breakfast                                Lunch  Supper Cranberry juice                    Beef broth                             Chicken broth Jell-O                                     Grape juice                           Apple juice Coffee or tea                        Jell-O                                      Popsicle                                                Coffee or tea                        Coffee or tea  _____________________________________________________________________  BRUSH YOUR TEETH MORNING OF SURGERY AND RINSE YOUR MOUTH OUT, NO CHEWING GUM CANDY OR MINTS.    Take these medicines the morning of surgery with A SIP OF WATER: sertraline,metoprolol,amlodipine.  How to Manage Your Diabetes Before and After Surgery  Why is it important to control my blood sugar before and after surgery? . Improving blood sugar levels before and after surgery helps healing and can limit problems. . A way of improving blood sugar control is eating a healthy diet by: o  Eating less sugar and carbohydrates o  Increasing activity/exercise o  Talking with your doctor about reaching your blood sugar goals . High blood sugars (greater than 180 mg/dL) can raise your risk of infections and slow your recovery, so you will need to focus on controlling your diabetes during the weeks before surgery. . Make sure that the doctor who takes care of your diabetes knows about your planned surgery including the date and location.  How do I manage my blood sugar before surgery? . Check your blood sugar at least 4 times a day, starting 2 days before surgery, to make sure that the level is not too high or low. o Check your blood sugar the morning of your surgery when you wake up and every 2 hours until you get to the Short Stay unit. . If your blood sugar is less than 70 mg/dL, you will need to treat for low blood sugar: o Do not take insulin. o Treat a low blood sugar (less than 70 mg/dL) with  cup of clear juice (cranberry or apple), 4 glucose tablets, OR glucose gel. o Recheck blood sugar in 15 minutes after treatment (to make  sure it is greater than 70 mg/dL). If your blood sugar is not greater than 70 mg/dL on recheck, call [phone] for further instructions. . Report your blood sugar to the short stay nurse when you get to Short Stay.  . If you are admitted  to the hospital after surgery: o Your blood sugar will be checked by the staff and you will probably be given insulin after surgery (instead of oral diabetes medicines) to make sure you have good blood sugar levels. o The goal for blood sugar control after surgery is 80-180 mg/dL.   WHAT DO I DO ABOUT MY DIABETES MEDICATION?  Marland Kitchen Do not take oral diabetes medicines (pills) the morning of surgery.  . THE DAY BEFORE SURGERY, take [place] in the morning as usual.       THE MORNING OF SURGERY, DO NOT TAKE ANY DIABETIC MEDICATIONS DAY OF YOUR SURGERY                                You may not have any metal on your body including hair pins and              piercings  Do not wear jewelry, lotions, powders or perfumes, deodorant             Men may shave face and neck.   Do not bring valuables to the hospital. [facility] IS NOT             RESPONSIBLE   FOR VALUABLES.  Contacts, dentures or bridgework may not be worn into surgery.  Leave suitcase in the car. After surgery it may be brought to your room.     Patients discharged the day of surgery will not be allowed to drive home. IF YOU ARE HAVING SURGERY AND GOING HOME THE SAME DAY, YOU MUST HAVE AN ADULT TO DRIVE YOU HOME AND BE WITH YOU FOR 24 HOURS. YOU MAY GO HOME BY TAXI OR UBER OR ORTHERWISE, BUT AN ADULT MUST ACCOMPANY YOU HOME AND STAY WITH YOU FOR 24 HOURS.  Name and phone number of your driver:  Special Instructions: N/A              Please read over the following fact sheets you were given: _____________________________________________________________________        [facility] - Preparing for Surgery Before surgery, you can play an important role.  Because skin is not sterile, your skin  needs to be as free of germs as possible.  You can reduce the number of germs on your skin by washing with CHG (chlorahexidine gluconate) soap before surgery.  CHG is an antiseptic cleaner which kills germs and bonds with the skin to continue killing germs even after washing. Please DO NOT use if you have an allergy to CHG or antibacterial soaps.  If your skin becomes reddened/irritated stop using the CHG and inform your nurse when you arrive at Short Stay. Do not shave (including legs and underarms) for at least 48 hours prior to the first CHG shower.  You may shave your face/neck. Please follow these instructions carefully:  1.  Shower with CHG Soap the night before surgery and the  morning of Surgery.  2.  If you choose to wash your hair, wash your hair first as usual with your  normal  shampoo.  3.  After you shampoo, rinse your hair and body thoroughly to remove the  shampoo.                           4.  Use CHG as you would any other liquid soap.  You can apply chg directly  to the skin and wash  Gently with a scrungie or clean washcloth.  5.  Apply the CHG Soap to your body ONLY FROM THE NECK DOWN.   Do not use on face/ open                           Wound or open sores. Avoid contact with eyes, ears mouth and genitals (private parts).                       Wash face,  Genitals (private parts) with your normal soap.             6.  Wash thoroughly, paying special attention to the area where your surgery  will be performed.  7.  Thoroughly rinse your body with warm water from the neck down.  8.  DO NOT shower/wash with your normal soap after using and rinsing off  the CHG Soap.                9.  Pat yourself dry with a clean towel.            10.  Wear clean pajamas.            11.  Place clean sheets on your bed the night of your first shower and do not  sleep with pets. Day of Surgery : Do not apply any lotions/deodorants the morning of surgery.  Please wear clean  clothes to the hospital/surgery center.  FAILURE TO FOLLOW THESE INSTRUCTIONS MAY RESULT IN THE CANCELLATION OF YOUR SURGERY PATIENT SIGNATURE_________________________________  NURSE SIGNATURE__________________________________  ________________________________________________________________________   Kenneth Barron  An incentive spirometer is a tool that can help keep your lungs clear and active. This tool measures how well you are filling your lungs with each breath. Taking long deep breaths may help reverse or decrease the chance of developing breathing (pulmonary) problems (especially infection) following:  A long period of time when you are unable to move or be active. BEFORE THE PROCEDURE   If the spirometer includes an indicator to show your best effort, your nurse or respiratory therapist will set it to a desired goal.  If possible, sit up straight or lean slightly forward. Try not to slouch.  Hold the incentive spirometer in an upright position. INSTRUCTIONS FOR USE  1. Sit on the edge of your bed if possible, or sit up as far as you can in bed or on a chair. 2. Hold the incentive spirometer in an upright position. 3. Breathe out normally. 4. Place the mouthpiece in your mouth and seal your lips tightly around it. 5. Breathe in slowly and as deeply as possible, raising the piston or the ball toward the top of the column. 6. Hold your breath for 3-5 seconds or for as long as possible. Allow the piston or ball to fall to the bottom of the column. 7. Remove the mouthpiece from your mouth and breathe out normally. 8. Rest for a few seconds and repeat Steps 1 through 7 at least 10 times every 1-2 hours when you are awake. Take your time and take a few normal breaths between deep breaths. 9. The spirometer may include an indicator to show your best effort. Use the indicator as a goal to work toward during each repetition. 10. After each set of 10 deep breaths, practice  coughing to be sure your lungs are clear. If you have an incision (the cut made at the time of  surgery), support your incision when coughing by placing a pillow or rolled up towels firmly against it. Once you are able to get out of bed, walk around indoors and cough well. You may stop using the incentive spirometer when instructed by your caregiver.  RISKS AND COMPLICATIONS  Take your time so you do not get dizzy or light-headed.  If you are in pain, you may need to take or ask for pain medication before doing incentive spirometry. It is harder to take a deep breath if you are having pain. AFTER USE  Rest and breathe slowly and easily.  It can be helpful to keep track of a log of your progress. Your caregiver can provide you with a simple table to help with this. If you are using the spirometer at home, follow these instructions: [place] IF:   You are having difficultly using the spirometer.  You have trouble using the spirometer as often as instructed.  Your pain medication is not giving enough relief while using the spirometer.  You develop fever of 100.5 F (38.1 C) or higher. SEEK IMMEDIATE MEDICAL CARE IF:   You cough up bloody sputum that had not been present before.  You develop fever of 102 F (38.9 C) or greater.  You develop worsening pain at or near the incision site. MAKE SURE YOU:   Understand these instructions.  Will watch your condition.  Will get help right away if you are not doing well or get worse. Document Released: 06/05/2006 Document Revised: 04/17/2011 Document Reviewed: 08/06/2006 [facility] Patient Information 2014 [place], [place].   ________________________________________________________________________

## 2020-07-02 ENCOUNTER — Encounter (HOSPITAL_COMMUNITY)
Admission: RE | Admit: 2020-07-02 | Discharge: 2020-07-02 | Disposition: A | Discharge status: Home / Self Care | Payer: Federal, State, Local not specified - PPO | Source: Ambulatory Visit | Attending: Specialist | Admitting: Specialist

## 2020-07-02 ENCOUNTER — Encounter (HOSPITAL_COMMUNITY): Payer: Self-pay

## 2020-07-02 ENCOUNTER — Other Ambulatory Visit: Payer: Self-pay

## 2020-07-02 DIAGNOSIS — Z01812 Encounter for preprocedural laboratory examination: Secondary | ICD-10-CM | POA: Diagnosis present

## 2020-07-02 HISTORY — DX: Unspecified osteoarthritis, unspecified site: M19.90

## 2020-07-02 HISTORY — DX: Pneumonia, unspecified organism: J18.9

## 2020-07-02 HISTORY — DX: Unspecified asthma, uncomplicated: J45.909

## 2020-07-02 LAB — BASIC METABOLIC PANEL
Anion gap: 7 (ref 5–15)
BUN: 17 mg/dL (ref 6–20)
CO2: 26 mmol/L (ref 22–32)
Calcium: 9.6 mg/dL (ref 8.9–10.3)
Chloride: 105 mmol/L (ref 98–111)
Creatinine, Ser: 1.18 mg/dL (ref 0.61–1.24)
GFR, Estimated: >60 mL/min (ref >60)
Glucose, Bld: 172 mg/dL — ABNORMAL HIGH (ref 70–99)
Potassium: 4.2 mmol/L (ref 3.5–5.1)
Sodium: 138 mmol/L (ref 135–145)

## 2020-07-02 LAB — GLUCOSE, CAPILLARY: Glucose-Capillary: 172 mg/dL — ABNORMAL HIGH (ref 70–99)

## 2020-07-02 LAB — CBC
HCT: 43.1 % (ref 39.0–52.0)
Hemoglobin: 14.1 g/dL (ref 13.0–17.0)
MCH: 30.9 pg (ref 26.0–34.0)
MCHC: 32.7 g/dL (ref 30.0–36.0)
MCV: 94.5 fL (ref 80.0–100.0)
Platelets: 308 10*3/uL (ref 150–400)
RBC: 4.56 MIL/uL (ref 4.22–5.81)
RDW: 14 % (ref 11.5–15.5)
WBC: 6.9 10*3/uL (ref 4.0–10.5)
nRBC: 0 % (ref 0.0–0.2)

## 2020-07-02 LAB — NO BLOOD PRODUCTS

## 2020-07-02 NOTE — Progress Notes (Signed)
COVID Vaccine Completed: Yes Date COVID Vaccine completed: 02/04/20 [place] COVID vaccine manufacturer: Pfizer     PCP - Dr. Synetta Fail: clearance: 06/08/20 Cardiologist - Dr. Zoila Shutter. LOV: 04/30/20  Chest x-ray - 01/12/20 EKG - 04/30/20 Stress Test -  ECHO - 04/30/20 Cardiac Cath -  Pacemaker/ICD device last checked:  Sleep Study - Yes CPAP - No  Fasting Blood Sugar - 130's Checks Blood Sugar __1___ times a day  Blood Thinner Instructions: Aspirin Instructions: Last Dose:  Anesthesia review: Hx: HTN,DIA,Syncope,OSA(No CPAP)  Patient denies shortness of breath, fever, cough and chest pain at PAT appointment   Patient verbalized understanding of instructions that were given to them at the PAT appointment. Patient was also instructed that they will need to review over the PAT instructions again at home before surgery.

## 2020-07-02 NOTE — Progress Notes (Signed)
Pt. Refuses blood products. 

## 2020-07-03 LAB — HEMOGLOBIN A1C
Hgb A1c MFr Bld: 8.5 % — ABNORMAL HIGH (ref 4.8–5.6)
Mean Plasma Glucose: 197 mg/dL

## 2020-07-06 NOTE — Progress Notes (Signed)
Lab. Results: A!-C: 8.5

## 2020-07-12 ENCOUNTER — Ambulatory Visit: Payer: Self-pay | Admitting: Orthopedic Surgery

## 2020-07-12 ENCOUNTER — Other Ambulatory Visit (HOSPITAL_COMMUNITY)
Admission: RE | Admit: 2020-07-12 | Discharge: 2020-07-12 | Disposition: A | Discharge status: Home / Self Care | Payer: Federal, State, Local not specified - PPO | Source: Ambulatory Visit | Attending: Specialist | Admitting: Specialist

## 2020-07-12 DIAGNOSIS — Z01812 Encounter for preprocedural laboratory examination: Secondary | ICD-10-CM | POA: Diagnosis present

## 2020-07-12 DIAGNOSIS — Z20822 Contact with and (suspected) exposure to covid-19: Secondary | ICD-10-CM | POA: Insufficient documentation

## 2020-07-12 LAB — SARS CORONAVIRUS 2 (TAT 6-24 HRS): SARS Coronavirus 2: NEGATIVE

## 2020-07-12 NOTE — H&P (Signed)
Kenneth Barron is an 55 y.o. male.   Chief Complaint: R shoulder pain HPI: Visit For: Follow up; Recheck Left shoulder LOV 11-11-19 Hand Dominance: left Location: left; shoulder Quality: aching; sharp (at times not just with ROM); occasional Severity: mild Context: No known injury Alleviating Factors: narcotics (Oxycodone prn for bilat. shoulders); NSAIDs (Aleve) Aggravating Factors: lifting; ROM (adduction); exercise Associated Symptoms: no numbness; no catching/locking; popping/clicking (at times); grinding Previous Surgery: Left shoulder RTCR 2010 Prior Imaging: x ray; MRI Previous Injections: helped temporarily (until the past month); 11-11-19 Left shoulder Work Related: no  Past Medical History:  Diagnosis Date  . Arthritis   . Asthma   . Diabetes mellitus without complication (HCC)    Type 2  . Hypertension   . Pneumonia     Past Surgical History:  Procedure Laterality Date  . HIP SURGERY     2 surgeries  . ROTATOR CUFF REPAIR Bilateral     Family History  Problem Relation Age of Onset  . Diabetes Mother   . Diabetes Father    Social History:  reports that he has never smoked. He has never used smokeless tobacco. He reports current drug use. Drug: Marijuana. He reports that he does not drink alcohol.  Allergies:  Allergies  Allergen Reactions  . Mixed Feathers Shortness Of Breath  . Pollen Extract Shortness Of Breath  . Other     Refuse blood products  . Metformin And Related Diarrhea and Nausea And Vomiting   Medications: amLODIPine 10 mg tablet ammonium lactate 12 % lotion atorvastatin 40 mg tablet ciclopirox 8 % topical solution Drysol Dab-O-Matic 20 % topical solution lisinopriL 20 mg-hydrochlorothiazide 25 mg tablet LORazepam 1 mg tablet metoprolol tartrate 25 mg tablet oxyCODONE-acetaminophen 5 mg-325 mg tablet Ozempic 0.25 mg or 0.5 mg (2 mg/1.5 mL) subcutaneous pen sertraline 50 mg tablet Tresiba FlexTouch U-100 insulin 100 unit/mL (3 mL)  subcutaneous pen Tresiba FlexTouch U-200 insulin 200 unit/mL (3 mL) subcutaneous pen  Review of Systems  Constitutional: Negative.   HENT: Negative.   Eyes: Negative.   Respiratory: Negative.   Cardiovascular: Negative.   Gastrointestinal: Negative.   Endocrine: Negative.   Genitourinary: Negative.   Musculoskeletal: Positive for arthralgias and myalgias.  Skin: Negative.   Neurological: Negative.   Psychiatric/Behavioral: Negative.     There were no vitals taken for this visit. Physical Exam Constitutional:      Appearance: Normal appearance.  HENT:     Head: Normocephalic.     Right Ear: External ear normal.     Left Ear: External ear normal.     Nose: Nose normal.     Mouth/Throat:     Pharynx: Oropharynx is clear.  Eyes:     Conjunctiva/sclera: Conjunctivae normal.  Cardiovascular:     Rate and Rhythm: Normal rate and regular rhythm.     Pulses: Normal pulses.     Heart sounds: Normal heart sounds.  Pulmonary:     Effort: Pulmonary effort is normal.     Breath sounds: Normal breath sounds.  Abdominal:     General: Bowel sounds are normal.  Musculoskeletal:     Cervical back: Normal range of motion.     Comments: Plus impingement sign positive second impingement sign the left shoulder. Decreased internal rotation.  Right shoulder AC arthrosis tender over the distal clavicle. Positive impingement sign of the shoulder positive second impingement sign.  Skin:    General: Skin is warm and dry.  Neurological:     Mental Status: He is  alert.    MRI of the right shoulder demonstrates multifocal tearing of the distal supraspinatus high-grade partial and full-thickness. Severe AC arthrosis.  MRI of the cervical spine demonstrate cervical spondylosis at C3-4. With moderate spinal stenosis and severe right foraminal narrowing. Moderate stenosis at C6-7 with moderate bilateral foraminal narrowing.  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  Plan:  We discussed the rotator cuff repair revision possible patch graft and distal clavicle resection An extensive discussion concerning the pathology relevant anatomy and treatment options. After that discussion we mutually agreed to proceed with repair of the rotator cuff utilizing arthroscopic assistance if possible. The risks and benefits of that procedure were discussed including bleeding, infection, suboptimal range of motion, deep venous thrombosis, pulmonary embolism, anesthetic complications etc. in addition we discussed the postoperative course to include approximately 4 weeks of passive range of motion followed by 4 weeks of active range of motion followed by 4-12 weeks of progressive strengthening exercises. In addition we discussed protective activities to reduce the risk of a reinjury including impingement activities with elbow above the shoulder as well as reaching and repetitive circular motion activities. The hospital stay will either be as a outpatient with a regional block versus overnight depending upon the extent of the procedure and any challenging health issues with a first postoperative visit 2 weeks following the surgery.  For his cervical spine we discussed activity modification I had a discussion with the patient concerning the anatomy and biomechanics of the cervical spine. In particular how to avoid positional neural compression by reducing neck extension during activities such as driving, sleeping, sitting at the computer and other activities. In addition to avoid prolonged flexion of the neck that typically aggravates the paraspinous musculature. Regular neck exercises would be beneficial as well.  A prescription for an opioid was given to be taken as directed for pain control. I discussed the risks including  cognitive changes which may affect the ability to operate machinery and to drive. In addition the side effect of constipation as well as to avoid taking with conflicting medications that were discussed. The patient's prescription drug monitoring report was reviewed with no red flags noted.  Patient works at the [employer] will need some time to schedule for the surgery.  I would recommend supervised physical therapy to improve range of motion as well as to improve strength and functional capacity. Modalities can also be utilized as needed for pain control.  Patient works at the [employer] we discussed 4 to 6 months until max medical improvement and can return to heavy lifting.  For his left shoulder we discussed MRI as well to evaluate whether he is got a recurrent tear in the left shoulder.  Plan Right shoulder revision RCR, SAD, DCR, arthroscopy, patch graft  Dorothy Spark, PA-C for Dr. Shelle Iron 07/12/2020, 11:17 AM

## 2020-07-12 NOTE — H&P (View-Only) (Signed)
Kenneth Barron is an 54 y.o. male.   Chief Complaint: R shoulder pain HPI: Visit For: Follow up; Recheck Left shoulder LOV 11-11-19 Hand Dominance: left Location: left; shoulder Quality: aching; sharp (at times not just with ROM); occasional Severity: mild Context: No known injury Alleviating Factors: narcotics (Oxycodone prn for bilat. shoulders); NSAIDs (Aleve) Aggravating Factors: lifting; ROM (adduction); exercise Associated Symptoms: no numbness; no catching/locking; popping/clicking (at times); grinding Previous Surgery: Left shoulder RTCR 2010 Prior Imaging: x ray; MRI Previous Injections: helped temporarily (until the past month); 11-11-19 Left shoulder Work Related: no  Past Medical History:  Diagnosis Date  . Arthritis   . Asthma   . Diabetes mellitus without complication (HCC)    Type 2  . Hypertension   . Pneumonia     Past Surgical History:  Procedure Laterality Date  . HIP SURGERY     2 surgeries  . ROTATOR CUFF REPAIR Bilateral     Family History  Problem Relation Age of Onset  . Diabetes Mother   . Diabetes Father    Social History:  reports that he has never smoked. He has never used smokeless tobacco. He reports current drug use. Drug: Marijuana. He reports that he does not drink alcohol.  Allergies:  Allergies  Allergen Reactions  . Mixed Feathers Shortness Of Breath  . Pollen Extract Shortness Of Breath  . Other     Refuse blood products  . Metformin And Related Diarrhea and Nausea And Vomiting   Medications: amLODIPine 10 mg tablet ammonium lactate 12 % lotion atorvastatin 40 mg tablet ciclopirox 8 % topical solution Drysol Dab-O-Matic 20 % topical solution lisinopriL 20 mg-hydrochlorothiazide 25 mg tablet LORazepam 1 mg tablet metoprolol tartrate 25 mg tablet oxyCODONE-acetaminophen 5 mg-325 mg tablet Ozempic 0.25 mg or 0.5 mg (2 mg/1.5 mL) subcutaneous pen sertraline 50 mg tablet Tresiba FlexTouch U-100 insulin 100 unit/mL (3 mL)  subcutaneous pen Tresiba FlexTouch U-200 insulin 200 unit/mL (3 mL) subcutaneous pen  Review of Systems  Constitutional: Negative.   HENT: Negative.   Eyes: Negative.   Respiratory: Negative.   Cardiovascular: Negative.   Gastrointestinal: Negative.   Endocrine: Negative.   Genitourinary: Negative.   Musculoskeletal: Positive for arthralgias and myalgias.  Skin: Negative.   Neurological: Negative.   Psychiatric/Behavioral: Negative.     There were no vitals taken for this visit. Physical Exam Constitutional:      Appearance: Normal appearance.  HENT:     Head: Normocephalic.     Right Ear: External ear normal.     Left Ear: External ear normal.     Nose: Nose normal.     Mouth/Throat:     Pharynx: Oropharynx is clear.  Eyes:     Conjunctiva/sclera: Conjunctivae normal.  Cardiovascular:     Rate and Rhythm: Normal rate and regular rhythm.     Pulses: Normal pulses.     Heart sounds: Normal heart sounds.  Pulmonary:     Effort: Pulmonary effort is normal.     Breath sounds: Normal breath sounds.  Abdominal:     General: Bowel sounds are normal.  Musculoskeletal:     Cervical back: Normal range of motion.     Comments: Plus impingement sign positive second impingement sign the left shoulder. Decreased internal rotation.  Right shoulder AC arthrosis tender over the distal clavicle. Positive impingement sign of the shoulder positive second impingement sign.  Skin:    General: Skin is warm and dry.  Neurological:     Mental Status: He is   alert.    MRI of the right shoulder demonstrates multifocal tearing of the distal supraspinatus high-grade partial and full-thickness. Severe AC arthrosis.  MRI of the cervical spine demonstrate cervical spondylosis at C3-4. With moderate spinal stenosis and severe right foraminal narrowing. Moderate stenosis at C6-7 with moderate bilateral foraminal narrowing.  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  Plan:  We discussed the rotator cuff repair revision possible patch graft and distal clavicle resection An extensive discussion concerning the pathology relevant anatomy and treatment options. After that discussion we mutually agreed to proceed with repair of the rotator cuff utilizing arthroscopic assistance if possible. The risks and benefits of that procedure were discussed including bleeding, infection, suboptimal range of motion, deep venous thrombosis, pulmonary embolism, anesthetic complications etc. in addition we discussed the postoperative course to include approximately 4 weeks of passive range of motion followed by 4 weeks of active range of motion followed by 4-12 weeks of progressive strengthening exercises. In addition we discussed protective activities to reduce the risk of a reinjury including impingement activities with elbow above the shoulder as well as reaching and repetitive circular motion activities. The hospital stay will either be as a outpatient with a regional block versus overnight depending upon the extent of the procedure and any challenging health issues with a first postoperative visit 2 weeks following the surgery.  For his cervical spine we discussed activity modification I had a discussion with the patient concerning the anatomy and biomechanics of the cervical spine. In particular how to avoid positional neural compression by reducing neck extension during activities such as driving, sleeping, sitting at the computer and other activities. In addition to avoid prolonged flexion of the neck that typically aggravates the paraspinous musculature. Regular neck exercises would be beneficial as well.  A prescription for an opioid was given to be taken as directed for pain control. I discussed the risks including  cognitive changes which may affect the ability to operate machinery and to drive. In addition the side effect of constipation as well as to avoid taking with conflicting medications that were discussed. The patient's prescription drug monitoring report was reviewed with no red flags noted.  Patient works at the [employer] will need some time to schedule for the surgery.  I would recommend supervised physical therapy to improve range of motion as well as to improve strength and functional capacity. Modalities can also be utilized as needed for pain control.  Patient works at the [employer] we discussed 4 to 6 months until max medical improvement and can return to heavy lifting.  For his left shoulder we discussed MRI as well to evaluate whether he is got a recurrent tear in the left shoulder.  Plan Right shoulder revision RCR, SAD, DCR, arthroscopy, patch graft  Dorothy Spark, PA-C for Dr. Shelle Iron 07/12/2020, 11:17 AM

## 2020-07-13 ENCOUNTER — Telehealth: Payer: Self-pay | Admitting: Internal Medicine

## 2020-07-13 NOTE — Telephone Encounter (Signed)
   Name:  Kenneth Barron  DOB:  07-Apr-1965  MRN:  [id number]   Primary Cardiologist: None  Chart reviewed as part of pre-operative protocol coverage. The patient was seen 04/2020 by Dr. Rennis Golden for work up of syncope. At that time an echocardiogram was ordered. The patient was a no show for his initial echo visit. This was rescheduled for 07/15/20. Unfortunately, his surgery is scheduled for 07/14/20. Ortho team has asked if the echocardiogram can be bypassed for the procedure which it cannot. We have tried several attempts to get the echocardiogram rescheduled for today however the schedule is full. This was communicated to the ortho team. He will need to proceed with the echo scheduled for 07/15/20 and shoulder surgery will need to be rescheduled.    Georgie Chard, NP 07/13/2020, 2:49 PM

## 2020-07-13 NOTE — Telephone Encounter (Signed)
LVM for [facility] 6/7

## 2020-07-13 NOTE — Telephone Encounter (Signed)
Sherri's call back # is [phone]

## 2020-07-13 NOTE — Telephone Encounter (Signed)
Cordelia Pen called earlier today prior to Epic shutting down for the scheduling team stating she faxed over an urgent surgical clearance request for the pt's surgery that is scheduled tomorrow tot he main [facility] fax number [phone]. She is wanting to know if the pt can be cleared for surgery without the Echo scheduled for 07/15/20. If not she is requesting the pt be worked in today, but the Echo lab does not currently have any availability. She is requesting a callback to her direct line to discuss so they can determine if rescheduling the surgery is required. Please advise.

## 2020-07-13 NOTE — Anesthesia Preprocedure Evaluation (Addendum)
Anesthesia Evaluation  Patient identified by MRN, date of birth, ID band Patient awake    Reviewed: Allergy & Precautions, NPO status , Patient's Chart, lab work & pertinent test results, reviewed documented beta blocker date and time   Airway Mallampati: III  TM Distance: >3 FB Neck ROM: Full    Dental no notable dental hx. (+) Teeth Intact, Dental Advisory Given   Pulmonary asthma ,    Pulmonary exam normal breath sounds clear to auscultation       Cardiovascular hypertension, Pt. on medications and Pt. on home beta blockers Normal cardiovascular exam Rhythm:Regular Rate:Normal  TTE 07/2020 1. Left ventricular ejection fraction, by estimation, is 65 to 70%. The  left ventricle has normal function. Left ventricular endocardial border  not optimally defined to evaluate regional wall motion. Left ventricular  diastolic parameters were normal.  2. Right ventricular systolic function is normal. The right ventricular  size is normal. Tricuspid regurgitation signal is inadequate for assessing  PA pressure.  3. The mitral valve is normal in structure. Trivial mitral valve  regurgitation. No evidence of mitral stenosis.  4. The aortic valve is grossly normal. Aortic valve regurgitation is not  visualized. No aortic stenosis is present.  5. The inferior vena cava is normal in size with greater than 50%  respiratory variability, suggesting right atrial pressure of 3 mmHg.   Neuro/Psych negative neurological ROS     GI/Hepatic negative GI ROS, Neg liver ROS,   Endo/Other  diabetes, Type 2, Insulin DependentMorbid obesity (BMI 43)  Renal/GU negative Renal ROS     Musculoskeletal  (+) Arthritis ,   Abdominal   Peds  Hematology negative hematology ROS (+)   Anesthesia Other Findings   Reproductive/Obstetrics                          Anesthesia Physical Anesthesia Plan  ASA: III  Anesthesia  Plan: General   Post-op Pain Management:    Induction: Intravenous  PONV Risk Score and Plan: 2 and Ondansetron and Dexamethasone  Airway Management Planned: Oral ETT  Additional Equipment:   Intra-op Plan:   Post-operative Plan: Extubation in OR  Informed Consent: I have reviewed the patients History and Physical, chart, labs and discussed the procedure including the risks, benefits and alternatives for the proposed anesthesia with the patient or authorized representative who has indicated his/her understanding and acceptance.     Dental advisory given  Plan Discussed with: Anesthesiologist  Anesthesia Plan Comments: (Pt declined regional anesthesia. Plan to utilize multimodal analgesia and to do a rescue block in PACU if pain uncontrolled. )     Anesthesia Quick Evaluation

## 2020-07-14 ENCOUNTER — Ambulatory Visit: Payer: Self-pay | Admitting: Orthopedic Surgery

## 2020-07-15 ENCOUNTER — Ambulatory Visit (HOSPITAL_COMMUNITY)
Admission: RE | Admit: 2020-07-15 | Discharge: 2020-07-15 | Disposition: A | Discharge status: Home / Self Care | Payer: Federal, State, Local not specified - PPO | Source: Ambulatory Visit | Attending: Internal Medicine | Admitting: Internal Medicine

## 2020-07-15 ENCOUNTER — Other Ambulatory Visit: Payer: Self-pay

## 2020-07-15 DIAGNOSIS — I10 Essential (primary) hypertension: Secondary | ICD-10-CM | POA: Diagnosis not present

## 2020-07-15 DIAGNOSIS — R55 Syncope and collapse: Secondary | ICD-10-CM | POA: Insufficient documentation

## 2020-07-15 DIAGNOSIS — E119 Type 2 diabetes mellitus without complications: Secondary | ICD-10-CM | POA: Diagnosis not present

## 2020-07-15 NOTE — Progress Notes (Signed)
  Echocardiogram 2D Echocardiogram has been performed.  Kenneth Barron 07/15/2020, 4:01 PM

## 2020-07-16 LAB — ECHOCARDIOGRAM COMPLETE
Area-P 1/2: 2.2 cm2
S' Lateral: 2.8 cm

## 2020-07-21 NOTE — Progress Notes (Addendum)
Mr. Krist is aware to arrive at 1130 AM 07/20/2020 no food after midnight clear liquids until 1030AM drink G2 at 1030 AM reviewed instructions, he verbalized understanding. Chart given to Shanda Bumps for review of 07/15/2020 ECHO report.

## 2020-07-21 NOTE — Progress Notes (Signed)
Attempted to obtain medical history via telephone, unable to reach at this time. I left a voicemail to return pre surgical testing department's phone call.  

## 2020-07-22 ENCOUNTER — Other Ambulatory Visit (HOSPITAL_COMMUNITY)
Admission: RE | Admit: 2020-07-22 | Discharge: 2020-07-22 | Disposition: A | Discharge status: Home / Self Care | Payer: Federal, State, Local not specified - PPO | Source: Ambulatory Visit | Attending: Specialist | Admitting: Specialist

## 2020-07-22 DIAGNOSIS — Z20822 Contact with and (suspected) exposure to covid-19: Secondary | ICD-10-CM | POA: Insufficient documentation

## 2020-07-22 DIAGNOSIS — Z01812 Encounter for preprocedural laboratory examination: Secondary | ICD-10-CM | POA: Diagnosis present

## 2020-07-22 LAB — SARS CORONAVIRUS 2 (TAT 6-24 HRS): SARS Coronavirus 2: NEGATIVE

## 2020-07-22 NOTE — Progress Notes (Signed)
Patient phoned stating that he has developed a sty on his eye and wanted to check to see if it will interfere with surgery scheduled for 07/23/20.  Patient stated that he has been applying warm compresses and taking antihistamines.  Patient was advised to contact surgeon's office for further recommendations, patient stated understanding.

## 2020-07-22 NOTE — Progress Notes (Signed)
Anesthesia Chart Review   Case: [id number] Date/Time: 07/23/20 1317   Procedure: Right shoulder arthroscopy revision rotator cuff repair, subacromial decompression, distal clavicle resection, possible patch graft (Right) -   Anesthesia type: General   Pre-op diagnosis: Recurrent rotator cuff tear, acromioclavicular arthrosis, right shoulder   Location: [facility] ROOM 09 / [facility]   Surgeons: Jene Every, MD       DISCUSSION:55 y.o. never smoker with h/o DM II, asthma, HTN, recurrent rotator cuff tear scheduled for above procedure 07/23/2020 with Dr. Jene Every.   Pt seen by cardiology 04/30/2020 following ED visit for episode of syncope.  Per cardiology notes Echo ordered to rule out structural heart disease, if echo normal pt advised to follow up prn.    Echo not completed at that time.  Discussed with surgeon's office, echo needed prior to upcoming procedure.   Echo 07/15/2020 with EF 65-70%, no valvular problems.   Anticipate pt can proceed with planned procedure barring acute status change.   VS: There were no vitals taken for this visit.  PROVIDERS: Malka So., MD is PCP    LABS: Labs reviewed: Acceptable for surgery. (all labs ordered are listed, but only abnormal results are displayed)  Labs Reviewed - No data to display   IMAGES:   EKG: 04/30/2020 Rate 52 bpm  Sinus bradycardia  CV: Echo 07/15/2020  1. Left ventricular ejection fraction, by estimation, is 65 to 70%. The  left ventricle has normal function. Left ventricular endocardial border  not optimally defined to evaluate regional wall motion. Left ventricular  diastolic parameters were normal.   2. Right ventricular systolic function is normal. The right ventricular  size is normal. Tricuspid regurgitation signal is inadequate for assessing  PA pressure.   3. The mitral valve is normal in structure. Trivial mitral valve  regurgitation. No evidence of mitral stenosis.   4. The aortic valve is grossly  normal. Aortic valve regurgitation is not  visualized. No aortic stenosis is present.   5. The inferior vena cava is normal in size with greater than 50%  respiratory variability, suggesting right atrial pressure of 3 mmHg. Past Medical History:  Diagnosis Date   Arthritis    Asthma    Diabetes mellitus without complication (HCC)    Type 2   Hypertension    Pneumonia     Past Surgical History:  Procedure Laterality Date   HIP SURGERY     2 surgeries   ROTATOR CUFF REPAIR Bilateral     MEDICATIONS: No current facility-administered medications for this encounter.    amLODipine (NORVASC) 10 MG tablet   ammonium lactate (LAC-HYDRIN) 12 % lotion   atorvastatin (LIPITOR) 20 MG tablet   insulin degludec (TRESIBA) 100 UNIT/ML SOPN FlexTouch Pen   lisinopril-hydrochlorothiazide (PRINZIDE,ZESTORETIC) 20-25 MG tablet   metoprolol tartrate (LOPRESSOR) 25 MG tablet   oxyCODONE-acetaminophen (PERCOCET/ROXICET) 5-325 MG tablet   Semaglutide,0.25 or 0.5MG /DOS, (OZEMPIC, 0.25 OR 0.5 MG/DOSE,) 2 MG/1.5ML SOPN   sertraline (ZOLOFT) 50 MG tablet   vitamin B-12 (CYANOCOBALAMIN) 1000 MCG tablet   alum & mag hydroxide-simeth (MAALOX PLUS) 400-400-40 MG/5ML suspension   TRESIBA FLEXTOUCH 200 UNIT/ML FlexTouch Pen    Jodell Cipro, PA-C [facility] Pre-Surgical Testing [phone]

## 2020-07-23 ENCOUNTER — Encounter (HOSPITAL_COMMUNITY)
Admission: RE | Disposition: A | Discharge status: Home / Self Care | Payer: Self-pay | Source: Ambulatory Visit | Attending: Specialist

## 2020-07-23 ENCOUNTER — Encounter (HOSPITAL_COMMUNITY): Payer: Self-pay | Admitting: Specialist

## 2020-07-23 ENCOUNTER — Ambulatory Visit (HOSPITAL_COMMUNITY): Payer: Federal, State, Local not specified - PPO | Admitting: Anesthesiology

## 2020-07-23 ENCOUNTER — Ambulatory Visit (HOSPITAL_COMMUNITY): Payer: Federal, State, Local not specified - PPO | Admitting: Physician Assistant

## 2020-07-23 ENCOUNTER — Other Ambulatory Visit: Payer: Self-pay

## 2020-07-23 ENCOUNTER — Ambulatory Visit (HOSPITAL_COMMUNITY)
Admission: RE | Admit: 2020-07-23 | Discharge: 2020-07-24 | Disposition: A | Discharge status: Home / Self Care | Payer: Federal, State, Local not specified - PPO | Source: Ambulatory Visit | Attending: Specialist | Admitting: Specialist

## 2020-07-23 DIAGNOSIS — Z79899 Other long term (current) drug therapy: Secondary | ICD-10-CM | POA: Insufficient documentation

## 2020-07-23 DIAGNOSIS — Z794 Long term (current) use of insulin: Secondary | ICD-10-CM | POA: Diagnosis not present

## 2020-07-23 DIAGNOSIS — M75101 Unspecified rotator cuff tear or rupture of right shoulder, not specified as traumatic: Secondary | ICD-10-CM | POA: Diagnosis present

## 2020-07-23 DIAGNOSIS — M75121 Complete rotator cuff tear or rupture of right shoulder, not specified as traumatic: Secondary | ICD-10-CM | POA: Insufficient documentation

## 2020-07-23 DIAGNOSIS — Z7982 Long term (current) use of aspirin: Secondary | ICD-10-CM | POA: Diagnosis not present

## 2020-07-23 DIAGNOSIS — M75120 Complete rotator cuff tear or rupture of unspecified shoulder, not specified as traumatic: Secondary | ICD-10-CM | POA: Diagnosis present

## 2020-07-23 DIAGNOSIS — E119 Type 2 diabetes mellitus without complications: Secondary | ICD-10-CM | POA: Insufficient documentation

## 2020-07-23 DIAGNOSIS — I10 Essential (primary) hypertension: Secondary | ICD-10-CM | POA: Insufficient documentation

## 2020-07-23 DIAGNOSIS — S46211A Strain of muscle, fascia and tendon of other parts of biceps, right arm, initial encounter: Secondary | ICD-10-CM | POA: Insufficient documentation

## 2020-07-23 DIAGNOSIS — X58XXXA Exposure to other specified factors, initial encounter: Secondary | ICD-10-CM | POA: Diagnosis not present

## 2020-07-23 DIAGNOSIS — M47812 Spondylosis without myelopathy or radiculopathy, cervical region: Secondary | ICD-10-CM | POA: Diagnosis not present

## 2020-07-23 DIAGNOSIS — M4802 Spinal stenosis, cervical region: Secondary | ICD-10-CM | POA: Insufficient documentation

## 2020-07-23 DIAGNOSIS — Z833 Family history of diabetes mellitus: Secondary | ICD-10-CM | POA: Insufficient documentation

## 2020-07-23 DIAGNOSIS — M19011 Primary osteoarthritis, right shoulder: Secondary | ICD-10-CM | POA: Insufficient documentation

## 2020-07-23 DIAGNOSIS — S43431A Superior glenoid labrum lesion of right shoulder, initial encounter: Secondary | ICD-10-CM | POA: Diagnosis not present

## 2020-07-23 DIAGNOSIS — J45909 Unspecified asthma, uncomplicated: Secondary | ICD-10-CM | POA: Insufficient documentation

## 2020-07-23 HISTORY — PX: SHOULDER ARTHROSCOPY WITH ROTATOR CUFF REPAIR AND SUBACROMIAL DECOMPRESSION: SHX5686

## 2020-07-23 LAB — GLUCOSE, CAPILLARY
Glucose-Capillary: 154 mg/dL — ABNORMAL HIGH (ref 70–99)
Glucose-Capillary: 139 mg/dL — ABNORMAL HIGH (ref 70–99)
Glucose-Capillary: 173 mg/dL — ABNORMAL HIGH (ref 70–99)
Glucose-Capillary: 168 mg/dL — ABNORMAL HIGH (ref 70–99)

## 2020-07-23 SURGERY — SHOULDER ARTHROSCOPY WITH ROTATOR CUFF REPAIR AND SUBACROMIAL DECOMPRESSION
Anesthesia: General | Site: Shoulder | Laterality: Right

## 2020-07-23 MED ORDER — ACETAMINOPHEN 500 MG PO TABS
1000.0000 mg | ORAL_TABLET | Freq: Once | ORAL | Status: AC
  Administered 2020-07-23: 1000 mg via ORAL
  Filled 2020-07-23: qty 2

## 2020-07-23 MED ORDER — ONDANSETRON HCL 4 MG/2ML IJ SOLN
4.0000 mg | Freq: Four times a day (QID) | INTRAMUSCULAR | Status: DC | PRN
  Administered 2020-07-23: 4 mg via INTRAVENOUS
  Filled 2020-07-23 (×2): qty 2

## 2020-07-23 MED ORDER — FENTANYL CITRATE (PF) 100 MCG/2ML IJ SOLN
INTRAMUSCULAR | Status: AC
  Filled 2020-07-23: qty 2

## 2020-07-23 MED ORDER — DEXAMETHASONE SODIUM PHOSPHATE 10 MG/ML IJ SOLN
INTRAMUSCULAR | Status: DC | PRN
  Administered 2020-07-23: 5 mg via INTRAVENOUS
  Administered 2020-07-23: 4 mg via INTRAVENOUS

## 2020-07-23 MED ORDER — PHENYLEPHRINE HCL-NACL 10-0.9 MG/250ML-% IV SOLN
INTRAVENOUS | Status: DC | PRN
  Administered 2020-07-23: 50 ug/min via INTRAVENOUS

## 2020-07-23 MED ORDER — POLYETHYLENE GLYCOL 3350 17 G PO PACK: 17.0000 g | PACK | Freq: Every day | ORAL | Status: DC | PRN

## 2020-07-23 MED ORDER — EPINEPHRINE PF 1 MG/ML IJ SOLN
INTRAMUSCULAR | Status: AC
  Filled 2020-07-23: qty 1

## 2020-07-23 MED ORDER — SERTRALINE HCL 50 MG PO TABS
50.0000 mg | ORAL_TABLET | Freq: Every day | ORAL | Status: DC
  Administered 2020-07-23 – 2020-07-24 (×2): 50 mg via ORAL
  Filled 2020-07-23 (×2): qty 1

## 2020-07-23 MED ORDER — FENTANYL CITRATE (PF) 100 MCG/2ML IJ SOLN
25.0000 ug | INTRAMUSCULAR | Status: DC | PRN
  Administered 2020-07-23 (×2): 50 ug via INTRAVENOUS

## 2020-07-23 MED ORDER — VITAMIN B-12 1000 MCG PO TABS
1000.0000 ug | ORAL_TABLET | Freq: Every day | ORAL | Status: DC
  Administered 2020-07-24: 1000 ug via ORAL
  Filled 2020-07-23: qty 1

## 2020-07-23 MED ORDER — DOCUSATE SODIUM 100 MG PO CAPS
100.0000 mg | ORAL_CAPSULE | Freq: Two times a day (BID) | ORAL | Status: DC
  Administered 2020-07-23 – 2020-07-24 (×2): 100 mg via ORAL
  Filled 2020-07-23 (×2): qty 1

## 2020-07-23 MED ORDER — MAGNESIUM CITRATE PO SOLN: 1.0000 | Freq: Once | ORAL | Status: DC | PRN

## 2020-07-23 MED ORDER — SODIUM CHLORIDE 0.9 % IR SOLN
Status: DC | PRN
  Administered 2020-07-23: 3000 mL

## 2020-07-23 MED ORDER — ACETAMINOPHEN 500 MG PO TABS: 1000.0000 mg | ORAL_TABLET | Freq: Once | ORAL | Status: DC

## 2020-07-23 MED ORDER — MIDAZOLAM HCL 2 MG/2ML IJ SOLN
1.0000 mg | INTRAMUSCULAR | Status: DC
  Filled 2020-07-23: qty 2

## 2020-07-23 MED ORDER — ONDANSETRON HCL 4 MG PO TABS: 4.0000 mg | ORAL_TABLET | Freq: Four times a day (QID) | ORAL | Status: DC | PRN

## 2020-07-23 MED ORDER — FENTANYL CITRATE (PF) 100 MCG/2ML IJ SOLN
50.0000 ug | INTRAMUSCULAR | Status: DC
Start: 2020-07-23 — End: 2020-07-23
  Filled 2020-07-23: qty 2

## 2020-07-23 MED ORDER — ALUM & MAG HYDROXIDE-SIMETH 200-200-20 MG/5ML PO SUSP: 30.0000 mL | ORAL | Status: DC | PRN

## 2020-07-23 MED ORDER — HYDROMORPHONE HCL 1 MG/ML IJ SOLN
0.5000 mg | INTRAMUSCULAR | Status: DC | PRN
  Administered 2020-07-23: 1 mg via INTRAVENOUS
  Administered 2020-07-23: 0.5 mg via INTRAVENOUS
  Filled 2020-07-23 (×2): qty 1

## 2020-07-23 MED ORDER — PHENYLEPHRINE HCL (PRESSORS) 10 MG/ML IV SOLN
INTRAVENOUS | Status: AC
  Filled 2020-07-23: qty 1

## 2020-07-23 MED ORDER — EPHEDRINE SULFATE-NACL 50-0.9 MG/10ML-% IV SOSY
PREFILLED_SYRINGE | INTRAVENOUS | Status: DC | PRN
  Administered 2020-07-23 (×2): 10 mg via INTRAVENOUS
  Administered 2020-07-23 (×2): 5 mg via INTRAVENOUS
  Administered 2020-07-23 (×2): 10 mg via INTRAVENOUS

## 2020-07-23 MED ORDER — ACETAMINOPHEN 500 MG PO TABS
1000.0000 mg | ORAL_TABLET | Freq: Four times a day (QID) | ORAL | Status: DC
  Administered 2020-07-23 – 2020-07-24 (×3): 1000 mg via ORAL
  Filled 2020-07-23 (×3): qty 2

## 2020-07-23 MED ORDER — METOPROLOL TARTRATE 25 MG PO TABS
25.0000 mg | ORAL_TABLET | Freq: Two times a day (BID) | ORAL | Status: DC
  Administered 2020-07-23 – 2020-07-24 (×2): 25 mg via ORAL
  Filled 2020-07-23 (×2): qty 1

## 2020-07-23 MED ORDER — OXYCODONE HCL 5 MG PO TABS
5.0000 mg | ORAL_TABLET | ORAL | Status: DC | PRN
  Administered 2020-07-24 (×2): 5 mg via ORAL
  Filled 2020-07-23 (×3): qty 1

## 2020-07-23 MED ORDER — LIDOCAINE 2% (20 MG/ML) 5 ML SYRINGE
INTRAMUSCULAR | Status: AC
  Filled 2020-07-23: qty 5

## 2020-07-23 MED ORDER — ROCURONIUM BROMIDE 10 MG/ML (PF) SYRINGE
PREFILLED_SYRINGE | INTRAVENOUS | Status: DC | PRN
  Administered 2020-07-23: 30 mg via INTRAVENOUS
  Administered 2020-07-23: 100 mg via INTRAVENOUS

## 2020-07-23 MED ORDER — ASPIRIN EC 81 MG PO TBEC: 81.0000 mg | DELAYED_RELEASE_TABLET | Freq: Every day | ORAL | 1 refills | Status: AC

## 2020-07-23 MED ORDER — ASPIRIN EC 325 MG PO TBEC
325.0000 mg | DELAYED_RELEASE_TABLET | Freq: Every day | ORAL | Status: DC
  Administered 2020-07-24: 325 mg via ORAL
  Filled 2020-07-23: qty 1

## 2020-07-23 MED ORDER — LACTATED RINGERS IV SOLN: INTRAVENOUS | Status: DC

## 2020-07-23 MED ORDER — ONDANSETRON HCL 4 MG/2ML IJ SOLN
INTRAMUSCULAR | Status: DC | PRN
  Administered 2020-07-23: 4 mg via INTRAVENOUS

## 2020-07-23 MED ORDER — OXYCODONE HCL 10 MG PO TABS: 10.0000 mg | ORAL_TABLET | ORAL | 0 refills | Status: AC | PRN

## 2020-07-23 MED ORDER — CEFAZOLIN SODIUM-DEXTROSE 2-4 GM/100ML-% IV SOLN
2.0000 g | Freq: Four times a day (QID) | INTRAVENOUS | Status: AC
  Administered 2020-07-23 (×2): 2 g via INTRAVENOUS
  Filled 2020-07-23 (×2): qty 100

## 2020-07-23 MED ORDER — INSULIN ASPART 100 UNIT/ML IJ SOLN
0.0000 [IU] | Freq: Three times a day (TID) | INTRAMUSCULAR | Status: DC
  Administered 2020-07-24: 3 [IU] via SUBCUTANEOUS

## 2020-07-23 MED ORDER — CEFAZOLIN IN SODIUM CHLORIDE 3-0.9 GM/100ML-% IV SOLN
3.0000 g | INTRAVENOUS | Status: AC
  Administered 2020-07-23: 3 g via INTRAVENOUS
  Filled 2020-07-23: qty 100

## 2020-07-23 MED ORDER — DIPHENHYDRAMINE HCL 12.5 MG/5ML PO ELIX: 12.5000 mg | ORAL_SOLUTION | ORAL | Status: DC | PRN

## 2020-07-23 MED ORDER — FENTANYL CITRATE (PF) 250 MCG/5ML IJ SOLN
INTRAMUSCULAR | Status: DC | PRN
  Administered 2020-07-23: 100 ug via INTRAVENOUS

## 2020-07-23 MED ORDER — CELECOXIB 200 MG PO CAPS
200.0000 mg | ORAL_CAPSULE | Freq: Once | ORAL | Status: AC
  Administered 2020-07-23: 200 mg via ORAL
  Filled 2020-07-23: qty 1

## 2020-07-23 MED ORDER — OXYCODONE HCL 5 MG PO TABS
10.0000 mg | ORAL_TABLET | ORAL | Status: DC | PRN
  Administered 2020-07-23: 10 mg via ORAL
  Filled 2020-07-23: qty 2

## 2020-07-23 MED ORDER — BISACODYL 5 MG PO TBEC: 5.0000 mg | DELAYED_RELEASE_TABLET | Freq: Every day | ORAL | Status: DC | PRN

## 2020-07-23 MED ORDER — DOCUSATE SODIUM 100 MG PO CAPS: 100.0000 mg | ORAL_CAPSULE | Freq: Two times a day (BID) | ORAL | 1 refills | Status: DC | PRN

## 2020-07-23 MED ORDER — ORAL CARE MOUTH RINSE: 15.0000 mL | Freq: Once | OROMUCOSAL | Status: AC

## 2020-07-23 MED ORDER — GLYCOPYRROLATE PF 0.2 MG/ML IJ SOSY
PREFILLED_SYRINGE | INTRAMUSCULAR | Status: DC | PRN
  Administered 2020-07-23: .1 mg via INTRAVENOUS

## 2020-07-23 MED ORDER — SUGAMMADEX SODIUM 200 MG/2ML IV SOLN
INTRAVENOUS | Status: DC | PRN
  Administered 2020-07-23: 500 mg via INTRAVENOUS

## 2020-07-23 MED ORDER — METOCLOPRAMIDE HCL 5 MG PO TABS: 5.0000 mg | ORAL_TABLET | Freq: Three times a day (TID) | ORAL | Status: DC | PRN

## 2020-07-23 MED ORDER — MENTHOL 3 MG MT LOZG: 1.0000 | LOZENGE | OROMUCOSAL | Status: DC | PRN

## 2020-07-23 MED ORDER — ROCURONIUM BROMIDE 10 MG/ML (PF) SYRINGE
PREFILLED_SYRINGE | INTRAVENOUS | Status: AC
  Filled 2020-07-23: qty 10

## 2020-07-23 MED ORDER — CHLORHEXIDINE GLUCONATE 0.12 % MT SOLN
15.0000 mL | Freq: Once | OROMUCOSAL | Status: AC
  Administered 2020-07-23: 15 mL via OROMUCOSAL

## 2020-07-23 MED ORDER — BUPIVACAINE-EPINEPHRINE 0.5% -1:200000 IJ SOLN
INTRAMUSCULAR | Status: DC | PRN
  Administered 2020-07-23: 30 mL

## 2020-07-23 MED ORDER — ONDANSETRON HCL 4 MG/2ML IJ SOLN
INTRAMUSCULAR | Status: AC
  Filled 2020-07-23: qty 2

## 2020-07-23 MED ORDER — RISAQUAD PO CAPS
1.0000 | ORAL_CAPSULE | Freq: Every day | ORAL | Status: DC
  Administered 2020-07-23 – 2020-07-24 (×2): 1 via ORAL
  Filled 2020-07-23 (×2): qty 1

## 2020-07-23 MED ORDER — PROPOFOL 10 MG/ML IV BOLUS
INTRAVENOUS | Status: DC | PRN
  Administered 2020-07-23: 50 mg via INTRAVENOUS
  Administered 2020-07-23: 150 mg via INTRAVENOUS

## 2020-07-23 MED ORDER — GLYCOPYRROLATE PF 0.2 MG/ML IJ SOSY
PREFILLED_SYRINGE | INTRAMUSCULAR | Status: AC
  Filled 2020-07-23: qty 1

## 2020-07-23 MED ORDER — PHENOL 1.4 % MT LIQD: 1.0000 | OROMUCOSAL | Status: DC | PRN

## 2020-07-23 MED ORDER — SUGAMMADEX SODIUM 500 MG/5ML IV SOLN
INTRAVENOUS | Status: AC
  Filled 2020-07-23: qty 5

## 2020-07-23 MED ORDER — ACETAMINOPHEN 325 MG PO TABS
325.0000 mg | ORAL_TABLET | Freq: Four times a day (QID) | ORAL | Status: DC | PRN
Start: 2020-07-24 — End: 2020-07-24
  Administered 2020-07-24: 650 mg via ORAL
  Filled 2020-07-23: qty 2

## 2020-07-23 MED ORDER — POLYETHYLENE GLYCOL 3350 17 G PO PACK: 17.0000 g | PACK | Freq: Every day | ORAL | 0 refills | Status: DC

## 2020-07-23 MED ORDER — LIDOCAINE 2% (20 MG/ML) 5 ML SYRINGE
INTRAMUSCULAR | Status: DC | PRN
  Administered 2020-07-23: 80 mg via INTRAVENOUS

## 2020-07-23 MED ORDER — AMLODIPINE BESYLATE 10 MG PO TABS
10.0000 mg | ORAL_TABLET | Freq: Every day | ORAL | Status: DC
  Administered 2020-07-23 – 2020-07-24 (×2): 10 mg via ORAL
  Filled 2020-07-23 (×2): qty 1

## 2020-07-23 MED ORDER — EPHEDRINE 5 MG/ML INJ
INTRAVENOUS | Status: AC
  Filled 2020-07-23: qty 10

## 2020-07-23 MED ORDER — KETAMINE HCL 10 MG/ML IJ SOLN
INTRAMUSCULAR | Status: DC | PRN
  Administered 2020-07-23: 30 mg via INTRAVENOUS
  Administered 2020-07-23: 20 mg via INTRAVENOUS

## 2020-07-23 MED ORDER — MIDAZOLAM HCL 2 MG/2ML IJ SOLN
INTRAMUSCULAR | Status: AC
  Filled 2020-07-23: qty 2

## 2020-07-23 MED ORDER — DEXMEDETOMIDINE (PRECEDEX) IN NS 20 MCG/5ML (4 MCG/ML) IV SYRINGE
PREFILLED_SYRINGE | INTRAVENOUS | Status: DC | PRN
  Administered 2020-07-23: 12 ug via INTRAVENOUS

## 2020-07-23 MED ORDER — METOPROLOL TARTRATE 25 MG PO TABS
25.0000 mg | ORAL_TABLET | Freq: Once | ORAL | Status: AC
  Administered 2020-07-23: 25 mg via ORAL
  Filled 2020-07-23: qty 1

## 2020-07-23 MED ORDER — DEXAMETHASONE SODIUM PHOSPHATE 10 MG/ML IJ SOLN
INTRAMUSCULAR | Status: AC
  Filled 2020-07-23: qty 1

## 2020-07-23 MED ORDER — KETAMINE HCL 10 MG/ML IJ SOLN
INTRAMUSCULAR | Status: AC
  Filled 2020-07-23: qty 1

## 2020-07-23 MED ORDER — PROPOFOL 500 MG/50ML IV EMUL
INTRAVENOUS | Status: AC
  Filled 2020-07-23: qty 50

## 2020-07-23 MED ORDER — METHOCARBAMOL 500 MG PO TABS: 500.0000 mg | ORAL_TABLET | Freq: Three times a day (TID) | ORAL | 1 refills | Status: AC | PRN

## 2020-07-23 MED ORDER — MIDAZOLAM HCL 5 MG/5ML IJ SOLN
INTRAMUSCULAR | Status: DC | PRN
  Administered 2020-07-23: 2 mg via INTRAVENOUS

## 2020-07-23 MED ORDER — BUPIVACAINE-EPINEPHRINE 0.5% -1:200000 IJ SOLN
INTRAMUSCULAR | Status: AC
  Filled 2020-07-23: qty 1

## 2020-07-23 MED ORDER — POTASSIUM CHLORIDE IN NACL 20-0.9 MEQ/L-% IV SOLN
INTRAVENOUS | Status: DC
  Filled 2020-07-23 (×2): qty 1000

## 2020-07-23 MED ORDER — EPINEPHRINE PF 1 MG/ML IJ SOLN
INTRAMUSCULAR | Status: DC | PRN
  Administered 2020-07-23: 1 mg

## 2020-07-23 MED ORDER — METOCLOPRAMIDE HCL 5 MG/ML IJ SOLN: 5.0000 mg | Freq: Three times a day (TID) | INTRAMUSCULAR | Status: DC | PRN

## 2020-07-23 SURGICAL SUPPLY — 60 items
ANCHOR NEEDLE 9/16 CIR SZ 8 (NEEDLE) IMPLANT
ANCHOR SUT BIO SW 4.75X19.1 (Anchor) ×6 IMPLANT
BLADE AVERAGE 25X9 (BLADE) ×2 IMPLANT
BLADE EXCALIBUR 4.0X13 (MISCELLANEOUS) ×2 IMPLANT
BLADE SURG SZ11 CARB STEEL (BLADE) ×2 IMPLANT
CANNULA ACUFO 5X76 (CANNULA) ×2 IMPLANT
CLEANER TIP ELECTROSURG 2X2 (MISCELLANEOUS) IMPLANT
COVER SURGICAL LIGHT HANDLE (MISCELLANEOUS) ×2 IMPLANT
COVER WAND RF STERILE (DRAPES) IMPLANT
DISSECTOR  3.8MM X 13CM (MISCELLANEOUS)
DISSECTOR 3.5MM X 13CM (MISCELLANEOUS) IMPLANT
DISSECTOR 3.8MM X 13CM (MISCELLANEOUS) IMPLANT
DRAPE POUCH INSTRU U-SHP 10X18 (DRAPES) ×2 IMPLANT
DRAPE STERI 35X30 U-POUCH (DRAPES) ×2 IMPLANT
DRSG AQUACEL AG ADV 3.5X 4 (GAUZE/BANDAGES/DRESSINGS) IMPLANT
DRSG AQUACEL AG ADV 3.5X 6 (GAUZE/BANDAGES/DRESSINGS) IMPLANT
DRSG PAD ABDOMINAL 8X10 ST (GAUZE/BANDAGES/DRESSINGS) IMPLANT
DURAPREP 26ML APPLICATOR (WOUND CARE) ×2 IMPLANT
ELECT NEEDLE TIP 2.8 STRL (NEEDLE) ×2 IMPLANT
ELECT REM PT RETURN 15FT ADLT (MISCELLANEOUS) ×2 IMPLANT
FILTER STRAW (MISCELLANEOUS) ×2 IMPLANT
GLOVE SRG 8 PF TXTR STRL LF DI (GLOVE) ×1 IMPLANT
GLOVE SURG POLYISO LF SZ7.5 (GLOVE) ×4 IMPLANT
GLOVE SURG POLYISO LF SZ8 (GLOVE) ×4 IMPLANT
GLOVE SURG UNDER POLY LF SZ7.5 (GLOVE) ×2 IMPLANT
GLOVE SURG UNDER POLY LF SZ8 (GLOVE) ×1
GOWN STRL REUS W/TWL XL LVL3 (GOWN DISPOSABLE) ×4 IMPLANT
KIT BASIN OR (CUSTOM PROCEDURE TRAY) ×2 IMPLANT
KIT TURNOVER KIT A (KITS) ×2 IMPLANT
MANIFOLD NEPTUNE II (INSTRUMENTS) ×2 IMPLANT
NEEDLE SCORPION MULTI FIRE (NEEDLE) IMPLANT
NEEDLE SPNL 18GX3.5 QUINCKE PK (NEEDLE) ×2 IMPLANT
PACK SHOULDER (CUSTOM PROCEDURE TRAY) ×2 IMPLANT
PENCIL SMOKE EVACUATOR (MISCELLANEOUS) IMPLANT
PORT APPOLLO RF 90DEGREE MULTI (SURGICAL WAND) IMPLANT
PROTECTOR NERVE ULNAR (MISCELLANEOUS) ×2 IMPLANT
RESTRAINT HEAD UNIVERSAL NS (MISCELLANEOUS) IMPLANT
SLING ARM IMMOBILIZER LRG (SOFTGOODS) IMPLANT
SLING ARM IMMOBILIZER MED (SOFTGOODS) IMPLANT
SLING ULTRA II L (ORTHOPEDIC SUPPLIES) IMPLANT
STRIP CLOSURE SKIN 1/2X4 (GAUZE/BANDAGES/DRESSINGS) IMPLANT
SUCTION FRAZIER HANDLE 12FR (TUBING) ×1
SUCTION TUBE FRAZIER 12FR DISP (TUBING) ×1 IMPLANT
SUT ETHIBOND NAB CT1 #1 30IN (SUTURE) IMPLANT
SUT ETHILON 4 0 PS 2 18 (SUTURE) ×2 IMPLANT
SUT FIBERWIRE #2 38 T-5 BLUE (SUTURE)
SUT PROLENE 3 0 PS 2 (SUTURE) ×2 IMPLANT
SUT TIGER TAPE 7 IN WHITE (SUTURE) IMPLANT
SUT VIC AB 0 CT1 27 (SUTURE) ×1
SUT VIC AB 0 CT1 27XBRD ANTBC (SUTURE) ×1 IMPLANT
SUT VIC AB 1-0 CT2 27 (SUTURE) IMPLANT
SUT VIC AB 2-0 CT2 27 (SUTURE) IMPLANT
SUT VICRYL 0 UR6 27IN ABS (SUTURE) ×2 IMPLANT
SUTURE FIBERWR #2 38 T-5 BLUE (SUTURE) IMPLANT
SYR 20ML LL LF (SYRINGE) ×2 IMPLANT
TAPE FIBER 2MM 7IN #2 BLUE (SUTURE) IMPLANT
TOWEL OR 17X26 10 PK STRL BLUE (TOWEL DISPOSABLE) ×2 IMPLANT
TUBING ARTHROSCOPY IRRIG 16FT (MISCELLANEOUS) ×2 IMPLANT
TUBING CONNECTING 10 (TUBING) ×4 IMPLANT
WIPE CHG CHLORHEXIDINE 2% (PERSONAL CARE ITEMS) ×2 IMPLANT

## 2020-07-23 NOTE — Discharge Instructions (Signed)
Aquacel dressing may remain in place until follow up. May shower with aquacel dressing in place. If the dressing becomes saturated or peels off, you may remove it and place a new dressing with gauze and tape which should be kept clean and dry and changed daily. °Use sling at times except when exercising or showering °No driving for 4-6 weeks °No lifting for 6 weeks operative arm °Pendulum exercises as instructed. °Ok to move wrist, elbow, and hand. °See Dr. Jaeger Trueheart in 10-14 days. Take one aspirin per day with a meal if not on a blood thinner or allergic to aspirin. °

## 2020-07-23 NOTE — Brief Op Note (Signed)
07/23/2020  3:24 PM  PATIENT:  Kenneth Barron  55 y.o. male  PRE-OPERATIVE DIAGNOSIS:  Recurrent rotator cuff tear, acromioclavicular arthrosis, right shoulder  POST-OPERATIVE DIAGNOSIS:  Recurrent rotator cuff tear, acromioclavicular arthrosis, right shoulder  PROCEDURE:  Procedure(s) with comments: Right shoulder arthroscopy revision rotator cuff repair, subacromial decompression, distal clavicle resection, possible patch graft (Right) -  SURGEON:  Surgeon(s) and Role:    Jene Every, MD - Primary  PHYSICIAN ASSISTANT:   ASSISTANTS: Bissell   ANESTHESIA:   general  EBL:  50 mL   BLOOD ADMINISTERED:none  DRAINS: none   LOCAL MEDICATIONS USED:  MARCAINE     SPECIMEN:  No Specimen  DISPOSITION OF SPECIMEN:  N/A  COUNTS:  YES  TOURNIQUET:  * No tourniquets in log *  DICTATION: .Other Dictation: Dictation Number   [id number]  PLAN OF CARE: Admit for overnight observation  PATIENT DISPOSITION:  PACU - hemodynamically stable.   Delay start of Pharmacological VTE agent (>24hrs) due to surgical blood loss or risk of bleeding: yes

## 2020-07-23 NOTE — Transfer of Care (Signed)
Immediate Anesthesia Transfer of Care Note  Patient: Kenneth Barron  Procedure(s) Performed: Right shoulder arthroscopy revision rotator cuff repair, subacromial decompression, distal clavicle resection, possible patch graft (Right: Shoulder)  Patient Location: PACU  Anesthesia Type:General  Level of Consciousness: awake, alert , drowsy and patient cooperative  Airway & Oxygen Therapy: Patient Spontanous Breathing and Patient connected to face mask oxygen  Post-op Assessment: Report given to RN, Post -op Vital signs reviewed and stable and Patient moving all extremities  Post vital signs: Reviewed and stable  Last Vitals:  Vitals Value Taken Time  BP 161/89 07/23/20 1547  Temp    Pulse 79 07/23/20 1549  Resp 10 07/23/20 1549  SpO2 97 % 07/23/20 1549  Vitals shown include unvalidated device data.  Last Pain:  Vitals:   07/23/20 1200  TempSrc:   PainSc: 0-No pain      Patients Stated Pain Goal: 5 (07/23/20 1200)  Complications: No notable events documented.

## 2020-07-23 NOTE — Anesthesia Postprocedure Evaluation (Signed)
Anesthesia Post Note  Patient: Kenneth Barron  Procedure(s) Performed: Right shoulder arthroscopy revision rotator cuff repair, subacromial decompression, distal clavicle resection, possible patch graft (Right: Shoulder)     Patient location during evaluation: PACU Anesthesia Type: General Level of consciousness: awake and alert Pain management: pain level controlled Vital Signs Assessment: post-procedure vital signs reviewed and stable Respiratory status: spontaneous breathing, nonlabored ventilation, respiratory function stable and patient connected to nasal cannula oxygen Cardiovascular status: blood pressure returned to baseline and stable Postop Assessment: no apparent nausea or vomiting Anesthetic complications: no   No notable events documented.  Last Vitals:  Vitals:   07/23/20 1630 07/23/20 1651  BP: 117/75 124/79  Pulse: 62 64  Resp: 11 18  Temp:  (!) 36.4 C  SpO2: 99% 95%    Last Pain:  Vitals:   07/23/20 1630  TempSrc:   PainSc: 3                  Roberta Angell L Quinnten Calvin

## 2020-07-23 NOTE — Anesthesia Procedure Notes (Signed)
Procedure Name: Intubation Date/Time: 07/23/2020 1:02 PM Performed by: Mitzie Na, CRNA Pre-anesthesia Checklist: Patient identified, Emergency Drugs available, Suction available and Patient being monitored Patient Re-evaluated:Patient Re-evaluated prior to induction Oxygen Delivery Method: Circle system utilized Preoxygenation: Pre-oxygenation with 100% oxygen Induction Type: IV induction Ventilation: Mask ventilation without difficulty and Oral airway inserted - appropriate to patient size Laryngoscope Size: Mac and 4 Grade View: Grade I Tube type: Oral Tube size: 7.5 mm Number of attempts: 1 Airway Equipment and Method: Stylet and Oral airway Placement Confirmation: ETT inserted through vocal cords under direct vision, positive ETCO2 and breath sounds checked- equal and bilateral Secured at: 24 cm Tube secured with: Tape Dental Injury: Teeth and Oropharynx as per pre-operative assessment

## 2020-07-23 NOTE — Op Note (Signed)
NAME: Kenneth Barron, Kenneth Barron MEDICAL RECORD NO: [id number] ACCOUNT NO: [id number] DATE OF BIRTH: Jul 02, 1965 FACILITY: [facility] LOCATION: [id number] PHYSICIAN: Javier Docker, MD  Operative Report   DATE OF PROCEDURE: 07/23/2020  PREOPERATIVE DIAGNOSES: 1.  Recurrent rotator cuff tear. 2.  Acromioclavicular arthrosis. 3.  Elevated body mass index of 42.4.  POSTOPERATIVE DIAGNOSES: 1.  Recurrent rotator cuff tear. 2.  Acromioclavicular arthrosis. 3.  Glenoid labral tear. 4.  Biceps tendon tear. 5.  Subacromial bursitis and impingement syndrome.  PROCEDURE PERFORMED:    1.  Right shoulder arthroscopy with subacromial bursectomy, subdeltoid, subcoracoid and debridement of anterior and posterior labral tear and debridement of biceps tendon tear. 2.  Mini open distal clavicle resection through a separate fascial incision. 3.  Mini open rotator cuff repair utilizing SwiveLock suture anchors with acromioplasty.  ANESTHESIA:  General.  ASSISTANT:  Skip Mayer, PA  INDICATIONS FOR PROCEDURE:  A 55 year old male, recurrent tear of the rotator cuff seen on MRI.  Attenuation with high grade partial and full thickness tear of the supraspinatus, severe AC arthrosis which is symptomatic.  He was indicated for shoulder  arthroscopy, decompression, distal clavicle resection, and revision rotator cuff repair.  Risks and benefits discussed including bleeding, infection, damage to neurovascular structures, no change in symptoms, worsening symptoms, DVT, PE, anesthetic  complication, need for revision in the future, etc.  DESCRIPTION OF PROCEDURE:  The patient in supine beach chair position, after induction of adequate general anesthesia, 3 grams Kefzol, the right shoulder and upper extremity was prepped and draped in the usual sterile fashion.  The patient had good range  of motion of the shoulder.  Surgical marker utilized to delineate the acromion, AC joint, coracoid.  A posterolateral portal was fashioned  with a #11 blade through the skin only in a standard position.  The arthroscopic camera was then inserted in the  subacromial space.  Noted was tear of the rotator cuff full thickness.  We were visualizing the glenohumeral joint.  With evaluation indicating anterior and posterior labral tears that were not detached.  Some fraying of the biceps tendon near the  attachment of the labrum.  The bicipital tendon was in its groove; however, it appeared to be intact as was the subscap.  He had some glenohumeral arthrosis.  I fashioned a lateral portal with #11 blade, triangulating the subacromial space and introduced  a shaver and debrided the anterior and posterior labrum to a stable base and shaved the degenerative tearing of the biceps tendon, perhaps it was 10% of thickness.  The remaining portion of the biceps tendon was intact.  Labrum was not detached.   Following this, I performed a bursectomy in the subacromial and subdeltoid space.  Viewing the full thickness tear of the rotator cuff, we proceeded then with open procedure.  I removed all instrumentation.  The arthroscopic irrigant that was insufflated  65 mmHg was evacuated.  Closed the portals with 4-0 nylon simple sutures.  I then made an incision over the distal aspect of the [facility] joint and extended it 3 cm over the anterolateral aspect of the acromion.  I then incised the capsule over the [facility] joint.   I evacuated clear synovial fluid.  Severe arthrosis noted.  I skeletonized the distal clavicle.  Placed Hohmann retractors anteriorly and posteriorly and a [employer] beneath it to protect the rotator cuff tendon.  I exposed the distal clavicle and  removed approximately a centimeter of the distal clavicle with an oscillating saw.  I then undercut  the inferior portion of the distal clavicle with 3 mm Kerrison.  Following this, the [facility] joint was irrigated.  We had good spacing between that and the  acromion.  No instability of the clavicle was noted.   Placed bone wax on the cancellous surfaces.  I then closed the capsule over the joint with 0 Vicryl interrupted figure-of-eight sutures.  Following this, I made a separate incision through the raphe  between the anterior and lateral heads of the deltoid.  I divided in line with the skin incision approximately 3 cm in length.  I placed a self-retaining retractor, entered the joint.  Some clear fluid was evacuated.  I then inspected the cuff, found the  high-grade tear in the supraspinatus lateral to the bicipital groove, mainly in the supraspinatus.  Small full thickness tear, this was completed with a 15 blade.  It measured approximately 1.5 cm x 1.5 cm.  Debrided the edges.  Then mobilized the  bursal side and the articular side of the tendon.  I then fashioned the trough in the greater tuberosity with an AO elevator to good bleeding tissue.  I then mobilized it to the lateral aspect of the greater tuberosity.  I placed one SwiveLock just  lateral to the articular surface in the bone fashioning the pilot hole with an awl and then inserting the SwiveLock with two arms of TigerTape, two leaflets anteriorly, two leaflets posteriorly.  Excellent resistance to pullout.  These were threaded  through the tendon anteriorly and posteriorly with a Scorpion suture passer.  These sutures were then crossed, advancing the tendon into the bed, securing it in a double row fashion over the greater tuberosity and into 2 SwiveLocks separated by 1.5 cm  after fashioning the pilot hole with an awl.  Inserting the suture leaflets with the arm at its side without undue tension.  The tear closed and converged on the bed.  This was secured in the second row.  Redundant suture was removed.  Just prior to  this, there was Ethibond suture from the previous repair that was removed with Frankey Poot rongeur.  Full coverage was noted.  Excellent repair.  Copiously irrigated the wound.  I then closed the raphe between the anterolateral heads with  0 Vicryl interrupted  figure-of-eight sutures.  Subcutaneous with 2-0, deltotrapezial fascia was closed as well.  Skin with Prolene.  Sterile dressing was applied.  Placed in an abduction pillow, extubated without difficulty and transported to the recovery room in  satisfactory condition.  The patient tolerated the procedure well.  No complications.  Assistant, Skip Mayer, PA, was used throughout the case to hold the patient's arm and closure.  The patient's BMI was 42.4.   SHW D: 07/23/2020 3:42:18 pm T: 07/23/2020 10:47:00 pm  JOB: [id number]/ [id number]

## 2020-07-23 NOTE — Op Note (Signed)
NAME: Kenneth Barron, Kenneth Barron MEDICAL RECORD NO: [id number] ACCOUNT NO: [id number] DATE OF BIRTH: Feb 03, 1966 FACILITY: [facility]ATION: [id number] PHYSICIAN: Javier Docker, MD  Operative Report   DATE OF PROCEDURE: 07/23/2020  PREOPERATIVE DIAGNOSES: 1.  Recurrent rotator cuff tear, right shoulder. 2.  Acromioclavicular arthrosis.  POSTOPERATIVE DIAGNOSES:  1.  Recurrent rotator cuff tear, supraspinatus, infraspinatus. 2.  Acromioclavicular arthrosis. 3.  Biceps tendon tear. 4.  Glenoid labral tear. 5.  Subacromial bursitis.  PROCEDURE PERFORMED:  1.  Right shoulder arthroscopy with subacromial bursectomy and debridement of anterior and posterior labral tear and biceps tendon tear and subdeltoid bursitis. 2.  Mini open distal clavicle resection through a separate fascial incision. 3.  Revision mini open rotator cuff repair with 3 SwiveLock suture anchors. 4.  Removal of retained suture.  Dictation Ends Here   SHW D: 07/23/2020 3:31:57 pm T: 07/23/2020 9:33:00 pm  JOB: [id number]/ [id number]

## 2020-07-23 NOTE — Interval H&P Note (Signed)
History and Physical Interval Note:  07/23/2020 12:27 PM  Kenneth Barron  has presented today for surgery, with the diagnosis of Recurrent rotator cuff tear, acromioclavicular arthrosis, right shoulder.  The various methods of treatment have been discussed with the patient and family. After consideration of risks, benefits and other options for treatment, the patient has consented to  Procedure(s) with comments: Right shoulder arthroscopy revision rotator cuff repair, subacromial decompression, distal clavicle resection, possible patch graft (Right) - as a surgical intervention.  The patient's history has been reviewed, patient examined, no change in status, stable for surgery.  I have reviewed the patient's chart and labs.  Questions were answered to the patient's satisfaction.     Javier Docker

## 2020-07-24 DIAGNOSIS — M75121 Complete rotator cuff tear or rupture of right shoulder, not specified as traumatic: Secondary | ICD-10-CM | POA: Diagnosis not present

## 2020-07-24 LAB — BASIC METABOLIC PANEL
Anion gap: 5 (ref 5–15)
BUN: 26 mg/dL — ABNORMAL HIGH (ref 6–20)
CO2: 25 mmol/L (ref 22–32)
Calcium: 8.7 mg/dL — ABNORMAL LOW (ref 8.9–10.3)
Chloride: 104 mmol/L (ref 98–111)
Creatinine, Ser: 1.17 mg/dL (ref 0.61–1.24)
GFR, Estimated: >60 mL/min (ref >60)
Glucose, Bld: 173 mg/dL — ABNORMAL HIGH (ref 70–99)
Potassium: 5.1 mmol/L (ref 3.5–5.1)
Sodium: 134 mmol/L — ABNORMAL LOW (ref 135–145)

## 2020-07-24 LAB — GLUCOSE, CAPILLARY: Glucose-Capillary: 158 mg/dL — ABNORMAL HIGH (ref 70–99)

## 2020-07-24 NOTE — Plan of Care (Signed)
Instructions were reviewed with patient. All questions were answered. Patient was transported to main entrance by wheelchair. ° °

## 2020-07-24 NOTE — Discharge Summary (Signed)
Patient ID: Kenneth Barron MRN: [id number] DOB/AGE: Apr 07, 1965 55 y.o.  Admit date: 07/23/2020 Discharge date:   Primary Diagnosis: Right shoulder rotator cuff repair Admission Diagnoses: Status post right shoulder arthroscopy revision rotator cuff repair, subacromial decompression, distal clavicle resection, possible patch graft right Past Medical History:  Diagnosis Date   Arthritis    Asthma    Diabetes mellitus without complication (HCC)    Type 2   Hypertension    Pneumonia    Discharge Diagnoses:   Active Problems:   Complete rotator cuff tear  Estimated body mass index is 42.4 kg/m as calculated from the following:   Height as of this encounter:  (1.803 m).   Weight as of this encounter: 137.9 kg.  Procedure:  Procedure(s) (LRB): Right shoulder arthroscopy revision rotator cuff repair, subacromial decompression, distal clavicle resection, possible patch graft (Right)   Consults: None  HPI: Kenneth Barron is a 55 year old male presenting to the operating room for a right rotator cuff repair, subacromial decompression, distal clavicle resection, and with possible patch graft.  He was seen in the office by Dr. Shelle Iron where he was evaluated and determination was made for operative treatment of the patient's rotator cuff repair.  Patient elected proceed and underwent the procedure on 07/23/2020.  He was admitted for overnight pain control and glucose monitoring.  Laboratory Data: Admission on 07/23/2020  Component Date Value Ref Range Status   Glucose-Capillary 07/23/2020 154 (A) 70 - 99 mg/dL Final   Glucose reference range applies only to samples taken after fasting for at least 8 hours.   Glucose-Capillary 07/23/2020 139 (A) 70 - 99 mg/dL Final   Glucose reference range applies only to samples taken after fasting for at least 8 hours.   Sodium 07/24/2020 134 (A) 135 - 145 mmol/L Final   Potassium 07/24/2020 5.1  3.5 - 5.1 mmol/L Final   Chloride 07/24/2020  104  98 - 111 mmol/L Final   CO2 07/24/2020 25  22 - 32 mmol/L Final   Glucose, Bld 07/24/2020 173 (A) 70 - 99 mg/dL Final   Glucose reference range applies only to samples taken after fasting for at least 8 hours.   BUN 07/24/2020 26 (A) 6 - 20 mg/dL Final   Creatinine, Ser 07/24/2020 1.17  0.61 - 1.24 mg/dL Final   Calcium 32/44/0102 8.7 (A) 8.9 - 10.3 mg/dL Final   GFR, Estimated 07/24/2020 >60  >60 mL/min Final   Comment: (NOTE) Calculated using the CKD-EPI Creatinine Equation (2021)    Anion gap 07/24/2020 5  5 - 15 Final   Performed at [facility], [address], [place], [place] [address]   Glucose-Capillary 07/23/2020 173 (A) 70 - 99 mg/dL Final   Glucose reference range applies only to samples taken after fasting for at least 8 hours.   Glucose-Capillary 07/23/2020 168 (A) 70 - 99 mg/dL Final   Glucose reference range applies only to samples taken after fasting for at least 8 hours.   Glucose-Capillary 07/24/2020 158 (A) 70 - 99 mg/dL Final   Glucose reference range applies only to samples taken after fasting for at least 8 hours.  Hospital Outpatient Visit on 07/22/2020  Component Date Value Ref Range Status   SARS Coronavirus 2 07/22/2020 NEGATIVE  NEGATIVE Final   Comment: (NOTE) SARS-CoV-2 target nucleic acids are NOT DETECTED.  The SARS-CoV-2 RNA is generally detectable in upper and lower respiratory specimens during the acute phase of infection. Negative results do not preclude SARS-CoV-2 infection, do not rule  out co-infections with other pathogens, and should not be used as the sole basis for treatment or other patient management decisions. Negative results must be combined with clinical observations, patient history, and epidemiological information. The expected result is Negative.  Fact Sheet for Patients: HairSlick.nohttps://www.fda.gov/media/138098/download  Fact Sheet for Healthcare Providers: quierodirigir.comhttps://www.fda.gov/media/138095/download  This test  is not yet approved or cleared by the [place] FDA and  has been authorized for detection and/or diagnosis of SARS-CoV-2 by FDA under an Emergency Use Authorization (EUA). This EUA will remain  in effect (meaning this test can be used) for the duration of the COVID-19 declaration under Se                          ction 564(b)(1) of the Act, 21 U.S.C. section 360bbb-3(b)(1), unless the authorization is terminated or revoked sooner.  Performed at [facility], [address] [place], [place] [address]   Hospital Outpatient Visit on 07/15/2020  Component Date Value Ref Range Status   S' Lateral 07/15/2020 2.80  cm Final   Area-P 1/2 07/15/2020 2.20  cm2 Final  Hospital Outpatient Visit on 07/12/2020  Component Date Value Ref Range Status   SARS Coronavirus 2 07/12/2020 NEGATIVE  NEGATIVE Final   Comment: (NOTE) SARS-CoV-2 target nucleic acids are NOT DETECTED.  The SARS-CoV-2 RNA is generally detectable in upper and lower respiratory specimens during the acute phase of infection. Negative results do not preclude SARS-CoV-2 infection, do not rule out co-infections with other pathogens, and should not be used as the sole basis for treatment or other patient management decisions. Negative results must be combined with clinical observations, patient history, and epidemiological information. The expected result is Negative.  Fact Sheet for Patients: HairSlick.nohttps://www.fda.gov/media/138098/download  Fact Sheet for Healthcare Providers: quierodirigir.comhttps://www.fda.gov/media/138095/download  This test is not yet approved or cleared by the [place] FDA and  has been authorized for detection and/or diagnosis of SARS-CoV-2 by FDA under an Emergency Use Authorization (EUA). This EUA will remain  in effect (meaning this test can be used) for the duration of the COVID-19 declaration under Se                          ction 564(b)(1) of the Act, 21 U.S.C. section 360bbb-3(b)(1), unless the  authorization is terminated or revoked sooner.  Performed at [facility], [address] [place], [place] [address]   Hospital Outpatient Visit on 07/02/2020  Component Date Value Ref Range Status   WBC 07/02/2020 6.9  4.0 - 10.5 K/uL Final   RBC 07/02/2020 4.56  4.22 - 5.81 MIL/uL Final   Hemoglobin 07/02/2020 14.1  13.0 - 17.0 g/dL Final   HCT 82/95/621305/27/2022 43.1  39.0 - 52.0 % Final   MCV 07/02/2020 94.5  80.0 - 100.0 fL Final   MCH 07/02/2020 30.9  26.0 - 34.0 pg Final   MCHC 07/02/2020 32.7  30.0 - 36.0 g/dL Final   RDW 08/65/784605/27/2022 14.0  11.5 - 15.5 % Final   Platelets 07/02/2020 308  150 - 400 K/uL Final   nRBC 07/02/2020 0.0  0.0 - 0.2 % Final   Performed at [facility], [address] [place], [place] [address]   Sodium 07/02/2020 138  135 - 145 mmol/L Final   Potassium 07/02/2020 4.2  3.5 - 5.1 mmol/L Final   Chloride 07/02/2020 105  98 - 111 mmol/L Final   CO2 07/02/2020 26  22 - 32 mmol/L Final   Glucose, Bld 07/02/2020 172 (A) 70 - 99 mg/dL Final   Glucose reference range applies only to samples taken after fasting for at least 8 hours.   BUN 07/02/2020 17  6 - 20 mg/dL Final   Creatinine, Ser 07/02/2020 1.18  0.61 - 1.24 mg/dL Final   Calcium 09/62/8366 9.6  8.9 - 10.3 mg/dL Final   GFR, Estimated 07/02/2020 >60  >60 mL/min Final   Comment: (NOTE) Calculated using the CKD-EPI Creatinine Equation (2021)    Anion gap 07/02/2020 7  5 - 15 Final   Performed at [facility], [address] [place], [place] [address]   Hgb A1c MFr Bld 07/02/2020 8.5 (A) 4.8 - 5.6 % Final   Comment: (NOTE)         Prediabetes: 5.7 - 6.4         Diabetes: >6.4         Glycemic control for adults with diabetes: <7.0    Mean Plasma Glucose 07/02/2020 197  mg/dL Final   Comment: (NOTE) Performed At: [facility] [address] [place], [place] [id number] Jolene Schimke MD [id number]    Glucose-Capillary 07/02/2020 172 (A) 70 - 99  mg/dL Final   Glucose reference range applies only to samples taken after fasting for at least 8 hours.   Transfuse no blood products 07/02/2020    Final                   Value:TRANSFUSE NO BLOOD PRODUCTS, VERIFIED BY Derek Mound RN ON 05.27.22 Performed at [facility], [address], [place], [place] [address]      X-Rays:ECHOCARDIOGRAM COMPLETE  Result Date: 07/16/2020    ECHOCARDIOGRAM REPORT   Patient Name:   Kenneth Barron Date of Exam: 07/15/2020 Medical Rec #:  [id number]          Height:       71.0 in Accession #:    [id number]         Weight:       304.0 lb Date of Birth:  March 30, 1965         BSA:          2.519 m Patient Age:    54 years           BP:           154/95 mmHg Patient Gender: M                  HR:           58 bpm. Exam Location:  Outpatient Procedure: 2D Echo Indications:    Syncope R55  History:        Patient has no prior history of Echocardiogram examinations.                 Risk Factors:Hypertension and Diabetes.  Sonographer:    Thurman Coyer RDCS (AE) Referring Phys: [id number] Lisette Abu HILTY  Sonographer Comments: Technically difficult study due to poor echo windows. IMPRESSIONS  1. Left ventricular ejection fraction, by estimation, is 65 to 70%. The left ventricle has normal function. Left ventricular endocardial border not optimally defined to evaluate regional wall motion. Left ventricular diastolic parameters were normal.  2. Right ventricular systolic function is normal. The right ventricular size is normal. Tricuspid regurgitation signal is inadequate for assessing PA pressure.  3. The mitral valve is normal in structure. Trivial mitral valve regurgitation. No evidence of mitral stenosis.  4. The aortic valve is grossly normal. Aortic valve regurgitation is not visualized. No aortic stenosis is present.  5. The inferior vena cava is normal in size with greater than 50% respiratory variability, suggesting right atrial pressure of 3 mmHg. FINDINGS   Left Ventricle: Left ventricular ejection fraction, by estimation, is 65 to 70%. The left ventricle has normal function. Left ventricular endocardial border not optimally defined to evaluate regional wall motion. The left ventricular internal cavity size was normal in size. There is no left ventricular hypertrophy. Left ventricular diastolic parameters were normal. Right Ventricle: The right ventricular size is normal. No increase in right ventricular wall thickness. Right ventricular systolic function is normal. Tricuspid regurgitation signal is inadequate for assessing PA pressure. Left Atrium: Left atrial size was normal in size. Right Atrium: Right atrial size was normal in size. Pericardium: There is no evidence of pericardial effusion. Presence of pericardial fat pad. Mitral Valve: The mitral valve is normal in structure. Trivial mitral valve regurgitation. No evidence of mitral valve stenosis. Tricuspid Valve: The tricuspid valve is normal in structure. Tricuspid valve regurgitation is not demonstrated. No evidence of tricuspid stenosis. Aortic Valve: The aortic valve is grossly normal. Aortic valve regurgitation is not visualized. No aortic stenosis is present. Pulmonic Valve: The pulmonic valve was not well visualized. Pulmonic valve regurgitation is trivial. No evidence of pulmonic stenosis. Aorta: The aortic root is normal in size and structure. Venous: The inferior vena cava is normal in size with greater than 50% respiratory variability, suggesting right atrial pressure of 3 mmHg. IAS/Shunts: The interatrial septum was not well visualized.  LEFT VENTRICLE PLAX 2D LVIDd:         4.20 cm  Diastology LVIDs:         2.80 cm  LV e' medial:    6.64 cm/s LV PW:         1.20 cm  LV E/e' medial:  8.9 LV IVS:        1.30 cm  LV e' lateral:   6.42 cm/s LVOT diam:     2.40 cm  LV E/e' lateral: 9.2 LV SV:         132 LV SV Index:   52 LVOT Area:     4.52 cm  LEFT ATRIUM             Index       RIGHT ATRIUM            Index LA diam:        3.60 cm 1.43 cm/m  RA Area:     14.10 cm LA Vol (A2C):   53.6 ml 21.28 ml/m RA Volume:   32.10 ml  12.74 ml/m LA Vol (A4C):   46.4 ml 18.42 ml/m LA Biplane Vol: 51.0 ml 20.24 ml/m  AORTIC VALVE LVOT Vmax:   136.00 cm/s LVOT Vmean:  98.800 cm/s LVOT VTI:    0.291 m  AORTA Ao Root diam: 3.40 cm MITRAL VALVE MV Area (PHT): 2.20 cm    SHUNTS MV Decel Time: 345 msec    Systemic VTI:  0.29 m MV E velocity: 59.10 cm/s  Systemic Diam: 2.40 cm MV A velocity: 61.70 cm/s MV E/A ratio:  0.96 Weston Brass MD Electronically signed by Weston Brass MD Signature Date/Time: 07/16/2020/11:26:01 AM    Final     EKG: Orders placed or performed in visit on 04/30/20   EKG 12-Lead     Hospital Course: Kenneth Barron is a 55 y.o. who was admitted  to Hospital. They were brought to the operating room on 07/23/2020 and underwent Procedure(s): Right shoulder arthroscopy revision rotator cuff repair, subacromial decompression, distal clavicle resection, possible patch graft.  Patient tolerated the procedure well and was later transferred to the recovery room and then to the orthopaedic floor for postoperative care.  They were given PO and IV analgesics for pain control following their surgery.  They were given 24 hours of postoperative antibiotics of  Anti-infectives (From admission, onward)    Start     Dose/Rate Route Frequency Ordered Stop   07/23/20 1800  ceFAZolin (ANCEF) IVPB 2g/100 mL premix        2 g 200 mL/hr over 30 Minutes Intravenous Every 6 hours 07/23/20 1653 07/24/20 0808   07/23/20 1145  ceFAZolin (ANCEF) IVPB 3g/100 mL premix        3 g 200 mL/hr over 30 Minutes Intravenous On call to O.R. 07/23/20 1141 07/23/20 1320      and started on DVT prophylaxis in the form of Aspirin.   Patient had a uneventful night on the evening of surgery.  Patient observed overnight for pain control and glucose monitoring. OT also evaluated the patient.  Patient was seen in rounds and was  ready to go home.   Diet: Regular diet Activity:NWB Follow-up:in 2 weeks Disposition - Home Discharged Condition: good    Allergies as of 07/24/2020       Reactions   Mixed Feathers Shortness Of Breath   Pollen Extract Shortness Of Breath   Other    Refuse blood products   Metformin And Related Diarrhea, Nausea And Vomiting        Medication List     STOP taking these medications    oxyCODONE-acetaminophen 5-325 MG tablet Commonly known as: PERCOCET/ROXICET       TAKE these medications    alum & mag hydroxide-simeth 400-400-40 MG/5ML suspension Commonly known as: MAALOX PLUS Take 15 mLs by mouth every 6 (six) hours as needed for indigestion.   amLODipine 10 MG tablet Commonly known as: NORVASC Take 10 mg by mouth daily.   ammonium lactate 12 % lotion Commonly known as: LAC-HYDRIN Apply 1 application topically daily.   aspirin EC 81 MG tablet Take 1 tablet (81 mg total) by mouth daily. Day after surgery   atorvastatin 20 MG tablet Commonly known as: LIPITOR Take 20 mg by mouth daily.   docusate sodium 100 MG capsule Commonly known as: Colace Take 1 capsule (100 mg total) by mouth 2 (two) times daily as needed for mild constipation.   insulin degludec 100 UNIT/ML FlexTouch Pen Commonly known as: TRESIBA Inject 46 Units into the skin daily.   lisinopril-hydrochlorothiazide 20-25 MG tablet Commonly known as: ZESTORETIC Take 1 tablet by mouth daily.   methocarbamol 500 MG tablet Commonly known as: Robaxin Take 1 tablet (500 mg total) by mouth every 8 (eight) hours as needed for muscle spasms.   metoprolol tartrate 25 MG tablet Commonly known as: LOPRESSOR Take 25 mg by mouth 2 (two) times daily.   Oxycodone HCl 10 MG Tabs Take 1 tablet (10 mg total) by mouth every 4 (four) hours as needed for up to 7 days.   Ozempic (0.25 or 0.5 MG/DOSE) 2 MG/1.5ML Sopn Generic drug: Semaglutide(0.25 or 0.5MG /DOS) Inject 0.5 mg into the skin once a week.    polyethylene glycol 17 g packet Commonly known as: MIRALAX / GLYCOLAX Take 17 g by mouth daily.   sertraline 50 MG tablet Commonly known as: ZOLOFT Take  50 mg by mouth daily.   vitamin B-12 1000 MCG tablet Commonly known as: CYANOCOBALAMIN Take 1,000 mcg by mouth daily.         Signed: Dion Saucier PA-C Orthopaedic Surgery 07/24/2020, 9:13 AM

## 2020-07-24 NOTE — Evaluation (Signed)
Occupational Therapy Evaluation Patient Details Name: Kenneth Barron MRN: [id number] DOB: 1965/05/09 Today's Date: 07/24/2020    History of Present Illness Pt.  underwent Right shoulder rotator cuff repair on 6/17 under Dr. Shelle Iron.  Pt with h/o of previous RT shoulder surgeries, LT shoulder sugery and hip surgery.   Clinical Impression   Pt is a 55 year old very pleasant male, s/p RT shoulder Rotator cuff surgery with limited functional use of non- dominant upper extremity secondary to effects of surgery and shoulder precautions. Therapist provided education and instruction to patient in regards to exercises, precautions, positioning, donning upper extremity clothing and bathing while maintaining shoulder precautions, ice and edema management and donning/doffing sling/abductor bolster. Patient verbalized understanding and demonstrated as needed. Patient needed no assistance to donn or doff shirt and socks and was provided with instruction on compensatory strategies to perform all ADLs. Pt will also have 24/7 assistance available at home as he recovers.  Patient verbalized and/or demonstrated understanding to all instruction and provided with handouts reinforcing ALL education today. Patient to follow up with MD for further therapy needs.        Follow Up Recommendations  Follow surgeon's recommendation for DC plan and follow-up therapies    Equipment Recommendations  None recommended by OT    Recommendations for Other Services       Precautions / Restrictions Precautions Precautions: Shoulder Type of Shoulder Precautions: Sling at all times except ADLs/exercises, No AROM RT shoulder. AROM to elbow/wrist/hand okay. Shoulder Interventions: Shoulder sling/immobilizer;Shoulder abduction pillow;Off for dressing/bathing/exercises;At all times Precaution Booklet Issued: Yes (comment) Precaution Comments: Pt educated on all precautions and handout provided and reviewed thoroughly with  pt. Required Braces or Orthoses: Sling Restrictions Weight Bearing Restrictions: Yes RUE Weight Bearing: Non weight bearing LUE Weight Bearing: Weight bearing as tolerated      Mobility Bed Mobility Overal bed mobility: Modified Independent             General bed mobility comments: Pt educated not to push with RUE to mobilize in/out of bed or chair. HOB elevated,. Pt plans to sleep in recliner. Pt verbalized understanding to have someone else work recliner level on RT side.    Transfers Overall transfer level: Modified independent               General transfer comment: Pt stood x 2 from EOB: Mod I (RUE immobilized)    Balance Overall balance assessment: Independent;Modified Independent                                         ADL either performed or assessed with clinical judgement   ADL Overall ADL's : Needs assistance/impaired Eating/Feeding: Set up   Grooming: Standing;Modified independent   Upper Body Bathing: Standing;Sitting;Modified independent Upper Body Bathing Details (indicate cue type and reason): After training on dangle method. Lower Body Bathing: Sitting/lateral leans;Sit to/from stand;Modified independent   Upper Body Dressing : Supervision/safety Upper Body Dressing Details (indicate cue type and reason): Pt trained on UB dressing and able to demo donning and offing tee shirt correctly while following precautions. Pt does not wear button down shirts. Mod I after training   Lower Body Dressing Details (indicate cue type and reason): Pt able to demo figure 4 techinque at EOB and one handed donning and doffing of socks. Pt reports he plans to go barefoot at home. Toilet Transfer: Engineer, agricultural Details (  indicate cue type and reason): BAsed on in-room mobility. Toileting- Architect and Hygiene: Min guard Toileting - Clothing Manipulation Details (indicate cue type and reason): Pt will need Min guard  for clothing mngt. Tub/ Shower Transfer: Grab bars;Supervision/safety   Functional mobility during ADLs: Modified independent;Independent       Vision   Vision Assessment?: No apparent visual deficits     Perception     Praxis      Pertinent Vitals/Pain Pain Assessment: Faces Faces Pain Scale: Hurts a little bit Pain Location: RT shoulder. Pain Intervention(s): Other (comment);Repositioned;Ice applied;Utilized relaxation techniques;Relaxation;Limited activity within patient's tolerance;Monitored during session (Surgical PA in room as pt reported pain.)     Hand Dominance Left (Writes with LT but ambidextrous.)   Extremity/Trunk Assessment Upper Extremity Assessment Upper Extremity Assessment: RUE deficits/detail;LUE deficits/detail RUE Deficits / Details: Pt able to perform hand/wrist/forearm/elbow exercises. RUE: Unable to fully assess due to immobilization RUE Sensation: WNL RUE Coordination: WNL LUE Deficits / Details: WNL       Cervical / Trunk Assessment Cervical / Trunk Assessment: Normal (Pt does endorse postural problems and has been working on that.)   Communication Communication Communication: No difficulties   Cognition Arousal/Alertness: Awake/alert Behavior During Therapy: WFL for tasks assessed/performed Overall Cognitive Status: Within Functional Limits for tasks assessed                                 General Comments: Very pleasant and eager to learn.   General Comments       Exercises Shoulder Exercises Pendulum Exercise: Standing;Right;10 reps (Lateral and post/anterior.) Elbow Flexion: AROM;Seated;10 reps;Right (Instructed to perform standing ad lib to allow elboe to come to max extension.) Elbow Extension: 10 reps;Seated;AROM;Right Wrist Flexion: Right;AROM;Seated;10 reps Wrist Extension: Right;AROM;10 reps;Seated Digit Composite Flexion: AROM;Seated;10 reps;Right Composite Extension: Right;10 reps;AROM;Seated Neck  Flexion: AROM;10 reps;Seated Neck Extension: AROM;10 reps;Seated Neck Lateral Flexion - Right: AROM;Seated Neck Lateral Flexion - Left: AROM;Seated Hand Exercises Forearm Supination: AROM;Seated;Right;10 reps Forearm Pronation: 10 reps;Seated;AROM;Right Other Exercises Other Exercises: Pt educated on upper trap and levator scapula stretches per request. Pt performed x 2 reps with 10-20 sec hold each while RUE in sling.  Pt also ed on uses of biofreeze or similar on area to pair with stretch. Cautioned to keep away from incision areas.   Shoulder Instructions Shoulder Instructions Donning/doffing shirt without moving shoulder: Modified independent;Patient able to independently direct caregiver Method for sponge bathing under operated UE: Modified independent (Using dangle method) Donning/doffing sling/immobilizer: Minimal assistance;Patient able to independently direct caregiver Correct positioning of sling/immobilizer: Independent;Patient able to independently direct caregiver Pendulum exercises (written home exercise program): Supervision/safety;Patient able to independently direct caregiver ROM for elbow, wrist and digits of operated UE: Independent;Patient able to independently direct caregiver Sling wearing schedule (on at all times/off for ADL's): Independent;Patient able to independently direct caregiver Proper positioning of operated UE when showering: Independent;Patient able to independently direct caregiver Dressing change: Patient able to independently direct caregiver Positioning of UE while sleeping: Independent;Patient able to independently direct caregiver    Home Living Family/patient expects to be discharged to:: Private residence Living Arrangements: Spouse/significant other;Other relatives Available Help at Discharge: Available 24 hours/day;Other (Comment) (Pt's partner works at [employer] as a Associate Professor from 2:0-0pm-10:30pm. Pt's niece is coming to stay with pt and provide  24/7 assist for as long as needed.)               Bathroom Shower/Tub: Tub/shower  unit         Home Equipment: Grab bars - tub/shower   Additional Comments: Recliner (not lift/ not electric, level on RT side).      Prior Functioning/Environment Level of Independence: Independent                 OT Problem List: Impaired UE functional use      OT Treatment/Interventions:      OT Goals(Current goals can be found in the care plan section) Acute Rehab OT Goals Patient Stated Goal: "Not to mess up this surgery." OT Goal Formulation: With patient Potential to Achieve Goals: Good  OT Frequency:     Barriers to D/C:            Co-evaluation              AM-PAC OT "6 Clicks" Daily Activity     Outcome Measure Help from another person eating meals?: None Help from another person taking care of personal grooming?: None Help from another person toileting, which includes using toliet, bedpan, or urinal?: A Little Help from another person bathing (including washing, rinsing, drying)?: A Little Help from another person to put on and taking off regular upper body clothing?: None Help from another person to put on and taking off regular lower body clothing?: A Little 6 Click Score: 21   End of Session Equipment Utilized During Treatment: Other (comment) (Sling and abduction pillow) Nurse Communication: Other (comment) (Pt sitting EOB. All OT instructions completed.)  Activity Tolerance: Patient tolerated treatment well Patient left: in bed (EOB)  OT Visit Diagnosis: Muscle weakness (generalized) (M62.81)                Time: 3299-2426 OT Time Calculation (min): 38 min Charges:  OT General Charges $OT Visit: 1 Visit OT Evaluation $OT Eval Low Complexity: 1 Low OT Treatments $Self Care/Home Management : 8-22 mins  Victorino Dike, OT Acute Rehab Services Office: [phone] 07/24/2020  Theodoro Clock 07/24/2020, 10:04 AM

## 2020-07-26 ENCOUNTER — Encounter (HOSPITAL_COMMUNITY): Payer: Self-pay | Admitting: Specialist

## 2020-12-19 ENCOUNTER — Emergency Department (HOSPITAL_COMMUNITY): Payer: Federal, State, Local not specified - PPO

## 2020-12-19 ENCOUNTER — Other Ambulatory Visit: Payer: Self-pay

## 2020-12-19 ENCOUNTER — Inpatient Hospital Stay (HOSPITAL_COMMUNITY)
Admission: EM | Admit: 2020-12-19 | Discharge: 2020-12-23 | DRG: 389 | Disposition: A | Discharge status: Home / Self Care | Payer: Federal, State, Local not specified - PPO | Attending: Internal Medicine | Admitting: Internal Medicine

## 2020-12-19 ENCOUNTER — Encounter (HOSPITAL_COMMUNITY): Payer: Self-pay

## 2020-12-19 DIAGNOSIS — E785 Hyperlipidemia, unspecified: Secondary | ICD-10-CM | POA: Diagnosis present

## 2020-12-19 DIAGNOSIS — E871 Hypo-osmolality and hyponatremia: Secondary | ICD-10-CM | POA: Diagnosis present

## 2020-12-19 DIAGNOSIS — Z20822 Contact with and (suspected) exposure to covid-19: Secondary | ICD-10-CM | POA: Diagnosis present

## 2020-12-19 DIAGNOSIS — E1159 Type 2 diabetes mellitus with other circulatory complications: Secondary | ICD-10-CM | POA: Diagnosis present

## 2020-12-19 DIAGNOSIS — K56609 Unspecified intestinal obstruction, unspecified as to partial versus complete obstruction: Secondary | ICD-10-CM | POA: Diagnosis not present

## 2020-12-19 DIAGNOSIS — Z888 Allergy status to other drugs, medicaments and biological substances status: Secondary | ICD-10-CM

## 2020-12-19 DIAGNOSIS — Z6841 Body Mass Index (BMI) 40.0 and over, adult: Secondary | ICD-10-CM

## 2020-12-19 DIAGNOSIS — E1165 Type 2 diabetes mellitus with hyperglycemia: Secondary | ICD-10-CM | POA: Diagnosis present

## 2020-12-19 DIAGNOSIS — J45909 Unspecified asthma, uncomplicated: Secondary | ICD-10-CM | POA: Diagnosis present

## 2020-12-19 DIAGNOSIS — I152 Hypertension secondary to endocrine disorders: Secondary | ICD-10-CM | POA: Diagnosis present

## 2020-12-19 DIAGNOSIS — Z91048 Other nonmedicinal substance allergy status: Secondary | ICD-10-CM

## 2020-12-19 DIAGNOSIS — Z794 Long term (current) use of insulin: Secondary | ICD-10-CM

## 2020-12-19 DIAGNOSIS — F32A Depression, unspecified: Secondary | ICD-10-CM

## 2020-12-19 DIAGNOSIS — Z833 Family history of diabetes mellitus: Secondary | ICD-10-CM

## 2020-12-19 DIAGNOSIS — E119 Type 2 diabetes mellitus without complications: Secondary | ICD-10-CM

## 2020-12-19 DIAGNOSIS — M1990 Unspecified osteoarthritis, unspecified site: Secondary | ICD-10-CM | POA: Diagnosis present

## 2020-12-19 DIAGNOSIS — E876 Hypokalemia: Secondary | ICD-10-CM

## 2020-12-19 DIAGNOSIS — E669 Obesity, unspecified: Secondary | ICD-10-CM | POA: Diagnosis present

## 2020-12-19 DIAGNOSIS — Z7982 Long term (current) use of aspirin: Secondary | ICD-10-CM

## 2020-12-19 DIAGNOSIS — E1169 Type 2 diabetes mellitus with other specified complication: Secondary | ICD-10-CM | POA: Diagnosis present

## 2020-12-19 DIAGNOSIS — Z79899 Other long term (current) drug therapy: Secondary | ICD-10-CM

## 2020-12-19 LAB — CBC WITH DIFFERENTIAL/PLATELET
Abs Immature Granulocytes: 0.08 10*3/uL — ABNORMAL HIGH (ref 0.00–0.07)
Basophils Absolute: 0 10*3/uL (ref 0.0–0.1)
Basophils Relative: 0 %
Eosinophils Absolute: 0.1 10*3/uL (ref 0.0–0.5)
Eosinophils Relative: 0 %
HCT: 48.6 % (ref 39.0–52.0)
Hemoglobin: 16.1 g/dL (ref 13.0–17.0)
Immature Granulocytes: 1 %
Lymphocytes Relative: 14 %
Lymphs Abs: 2 10*3/uL (ref 0.7–4.0)
MCH: 30.7 pg (ref 26.0–34.0)
MCHC: 33.1 g/dL (ref 30.0–36.0)
MCV: 92.6 fL (ref 80.0–100.0)
Monocytes Absolute: 0.5 10*3/uL (ref 0.1–1.0)
Monocytes Relative: 4 %
Neutro Abs: 11.4 10*3/uL — ABNORMAL HIGH (ref 1.7–7.7)
Neutrophils Relative %: 81 %
Platelets: 406 10*3/uL — ABNORMAL HIGH (ref 150–400)
RBC: 5.25 MIL/uL (ref 4.22–5.81)
RDW: 13.4 % (ref 11.5–15.5)
WBC: 14.1 10*3/uL — ABNORMAL HIGH (ref 4.0–10.5)
nRBC: 0 % (ref 0.0–0.2)

## 2020-12-19 LAB — COMPREHENSIVE METABOLIC PANEL
ALT: 26 U/L (ref 0–44)
AST: 22 U/L (ref 15–41)
Albumin: 4.9 g/dL (ref 3.5–5.0)
Alkaline Phosphatase: 105 U/L (ref 38–126)
Anion gap: 15 (ref 5–15)
BUN: 19 mg/dL (ref 6–20)
CO2: 20 mmol/L — ABNORMAL LOW (ref 22–32)
Calcium: 10.3 mg/dL (ref 8.9–10.3)
Chloride: 99 mmol/L (ref 98–111)
Creatinine, Ser: 0.91 mg/dL (ref 0.61–1.24)
GFR, Estimated: >60 mL/min (ref >60)
Glucose, Bld: 219 mg/dL — ABNORMAL HIGH (ref 70–99)
Potassium: 3.4 mmol/L — ABNORMAL LOW (ref 3.5–5.1)
Sodium: 134 mmol/L — ABNORMAL LOW (ref 135–145)
Total Bilirubin: 0.8 mg/dL (ref 0.3–1.2)
Total Protein: 8.9 g/dL — ABNORMAL HIGH (ref 6.5–8.1)

## 2020-12-19 LAB — RESP PANEL BY RT-PCR (FLU A&B, COVID) ARPGX2
Influenza A by PCR: NEGATIVE
Influenza B by PCR: NEGATIVE
SARS Coronavirus 2 by RT PCR: NEGATIVE

## 2020-12-19 LAB — LIPASE, BLOOD: Lipase: 35 U/L (ref 11–51)

## 2020-12-19 MED ORDER — MORPHINE SULFATE (PF) 4 MG/ML IV SOLN
4.0000 mg | Freq: Once | INTRAVENOUS | Status: AC
Start: 2020-12-19 — End: 2020-12-19
  Administered 2020-12-19: 4 mg via INTRAVENOUS
  Filled 2020-12-19: qty 1

## 2020-12-19 MED ORDER — LABETALOL HCL 5 MG/ML IV SOLN
10.0000 mg | INTRAVENOUS | Status: DC | PRN
  Administered 2020-12-19 – 2020-12-21 (×2): 10 mg via INTRAVENOUS
  Filled 2020-12-19 (×2): qty 4

## 2020-12-19 MED ORDER — MORPHINE SULFATE (PF) 4 MG/ML IV SOLN
4.0000 mg | Freq: Once | INTRAVENOUS | Status: DC
  Administered 2020-12-19: 2 mg via INTRAVENOUS
  Filled 2020-12-19: qty 1

## 2020-12-19 MED ORDER — HEPARIN SODIUM (PORCINE) 5000 UNIT/ML IJ SOLN
5000.0000 [IU] | Freq: Three times a day (TID) | INTRAMUSCULAR | Status: DC
  Filled 2020-12-19 (×3): qty 1

## 2020-12-19 MED ORDER — MORPHINE SULFATE (PF) 2 MG/ML IV SOLN
2.0000 mg | INTRAVENOUS | Status: DC | PRN
  Administered 2020-12-20 – 2020-12-22 (×7): 2 mg via INTRAVENOUS
  Filled 2020-12-19 (×7): qty 1

## 2020-12-19 MED ORDER — ONDANSETRON 4 MG PO TBDP
4.0000 mg | ORAL_TABLET | Freq: Once | ORAL | Status: AC
  Administered 2020-12-19: 4 mg via ORAL
  Filled 2020-12-19: qty 1

## 2020-12-19 MED ORDER — IOHEXOL 350 MG/ML SOLN
80.0000 mL | Freq: Once | INTRAVENOUS | Status: AC | PRN
  Administered 2020-12-19: 100 mL via INTRAVENOUS

## 2020-12-19 MED ORDER — ONDANSETRON HCL 4 MG/2ML IJ SOLN
4.0000 mg | Freq: Four times a day (QID) | INTRAMUSCULAR | Status: DC | PRN
  Administered 2020-12-19 – 2020-12-21 (×4): 4 mg via INTRAVENOUS
  Filled 2020-12-19 (×3): qty 2

## 2020-12-19 MED ORDER — POTASSIUM CHLORIDE IN NACL 40-0.9 MEQ/L-% IV SOLN
INTRAVENOUS | Status: DC
  Filled 2020-12-19 (×2): qty 1000

## 2020-12-19 MED ORDER — ONDANSETRON HCL 4 MG PO TABS: 4.0000 mg | ORAL_TABLET | Freq: Four times a day (QID) | ORAL | Status: DC | PRN

## 2020-12-19 MED ORDER — INSULIN ASPART 100 UNIT/ML IJ SOLN
0.0000 [IU] | INTRAMUSCULAR | Status: DC
  Administered 2020-12-20 (×2): 2 [IU] via SUBCUTANEOUS
  Administered 2020-12-20: 3 [IU] via SUBCUTANEOUS
  Administered 2020-12-21 – 2020-12-22 (×3): 2 [IU] via SUBCUTANEOUS
  Administered 2020-12-22 – 2020-12-23 (×2): 3 [IU] via SUBCUTANEOUS
  Filled 2020-12-19: qty 0.15

## 2020-12-19 MED ORDER — SODIUM CHLORIDE 0.9% FLUSH
3.0000 mL | Freq: Two times a day (BID) | INTRAVENOUS | Status: DC
  Administered 2020-12-22 (×2): 3 mL via INTRAVENOUS

## 2020-12-19 MED ORDER — ONDANSETRON HCL 4 MG/2ML IJ SOLN
4.0000 mg | Freq: Once | INTRAMUSCULAR | Status: DC
  Filled 2020-12-19: qty 2

## 2020-12-19 NOTE — H&P (Signed)
History and Physical    Toribio Seiber SWF:[id number] DOB: 07/21/1965 DOA: 12/19/2020  PCP: Malka So., MD  Patient coming from: Home via EMS  I have personally briefly reviewed patient's old medical records in [facility]  Chief Complaint: Nausea, vomiting, abdominal pain  HPI: Kenneth Barron is a 55 y.o. male with medical history significant for insulin-dependent type 2 diabetes, hypertension, hyperlipidemia, asthma, depression who presented to the ED for evaluation of nausea, vomiting, abdominal pain.  Patient reports sudden onset of generalized abdominal pain, nausea, vomiting beginning morning of admission (11/13).  He has been having frequent dry heaves.  He did have a normal-appearing bowel movement this afternoon and states that he is continuing to pass gas.  He has not been able to eat or drink anything since his symptoms began.  He had intermittent chills and cold sweats.  He denies any chest pain, dyspnea, dysuria.  Patient does report 2 episodes of bowel obstruction in the past, last time was 12-13 years ago when he was living in [place], at which time he required NG tube decompression.  He denies any prior abdominal surgeries.  He has not had any recent changes in his medications.  ED Course:  Initial vitals showed BP 159/112, pulse 75, RR 20, temp 98.1 F, SPO2 97% on room air.  Labs show WBC 14.1, hemoglobin 16.1, platelets 406,000, sodium 134, potassium 3.4, bicarb 20, BUN 19, creatinine 0.91, serum glucose 219, LFTs within normal limits, lipase 35.  COVID and influenza PCR negative.  CT abdomen/pelvis with contrast shows dilated loops of small bowel in the abdomen with scattered air-fluid levels suspicious for SBO.  Small bowel is collapsed distal to suspected transition point in the right lower quadrant.  Patient was given IV morphine 4 mg, IV Zofran.  General surgery were consulted and recommended medical admission and they will follow.  The  hospitalist service was consulted to admit for further evaluation and management.  Review of Systems: All systems reviewed and are negative except as documented in history of present illness above.   Past Medical History:  Diagnosis Date   Arthritis    Asthma    Diabetes mellitus without complication (HCC)    Type 2   Hypertension    Pneumonia     Past Surgical History:  Procedure Laterality Date   HIP SURGERY     2 surgeries   ROTATOR CUFF REPAIR Bilateral    SHOULDER ARTHROSCOPY WITH ROTATOR CUFF REPAIR AND SUBACROMIAL DECOMPRESSION Right 07/23/2020   Procedure: Right shoulder arthroscopy revision rotator cuff repair, subacromial decompression, distal clavicle resection, possible patch graft;  Surgeon: Jene Every, MD;  Location: [facility];  Service: Orthopedics;  Laterality: Right;     Social History:  reports that he has never smoked. He has never used smokeless tobacco. He reports current drug use. Drug: Marijuana. He reports that he does not drink alcohol.  Allergies  Allergen Reactions   Mixed Feathers Shortness Of Breath   Pollen Extract Shortness Of Breath   Other     Refuse blood products   Metformin And Related Diarrhea and Nausea And Vomiting    Family History  Problem Relation Age of Onset   Diabetes Mother    Diabetes Father      Prior to Admission medications   Medication Sig Start Date End Date Taking? Authorizing Provider  alum & mag hydroxide-simeth (MAALOX PLUS) 400-400-40 MG/5ML suspension Take 15 mLs by mouth every 6 (six) hours as needed for indigestion. 01/12/20  Wieters, Hallie C, PA-C  amLODipine (NORVASC) 10 MG tablet Take 10 mg by mouth daily. 02/13/17   [provider]  ammonium lactate (LAC-HYDRIN) 12 % lotion Apply 1 application topically daily.    [provider]  aspirin EC 81 MG tablet Take 1 tablet (81 mg total) by mouth daily. Day after surgery 07/23/20   Jene Every, MD  atorvastatin (LIPITOR) 20 MG tablet  Take 20 mg by mouth daily. 03/10/17   [provider]  docusate sodium (COLACE) 100 MG capsule Take 1 capsule (100 mg total) by mouth 2 (two) times daily as needed for mild constipation. 07/23/20   Jene Every, MD  insulin degludec (TRESIBA) 100 UNIT/ML SOPN FlexTouch Pen Inject 46 Units into the skin daily. 11/09/16 01/31/22  [provider]  lisinopril-hydrochlorothiazide (PRINZIDE,ZESTORETIC) 20-25 MG tablet Take 1 tablet by mouth daily. 01/11/17   [provider]  methocarbamol (ROBAXIN) 500 MG tablet Take 1 tablet (500 mg total) by mouth every 8 (eight) hours as needed for muscle spasms. 07/23/20   Jene Every, MD  metoprolol tartrate (LOPRESSOR) 25 MG tablet Take 25 mg by mouth 2 (two) times daily. 04/23/19   [provider]  polyethylene glycol (MIRALAX / GLYCOLAX) 17 g packet Take 17 g by mouth daily. 07/23/20   Jene Every, MD  Semaglutide,0.25 or 0.5MG /DOS, (OZEMPIC, 0.25 OR 0.5 MG/DOSE,) 2 MG/1.5ML SOPN Inject 0.5 mg into the skin once a week.    [provider]  sertraline (ZOLOFT) 50 MG tablet Take 50 mg by mouth daily. 03/10/17   [provider]  vitamin B-12 (CYANOCOBALAMIN) 1000 MCG tablet Take 1,000 mcg by mouth daily.    [provider]    Physical Exam: Vitals:   12/19/20 2151 12/19/20 2215 12/19/20 2236 12/19/20 2246  BP: (!) 187/119 (!) 181/112    Pulse: 81 82 85 81  Resp: 16     Temp:      TempSrc:      SpO2: 92% 92% 93% 93%   Constitutional: Sitting up in chair, NAD, calm, comfortable Eyes: PERRL, lids and conjunctivae normal ENMT: Mucous membranes are dry. Posterior pharynx clear of any exudate or lesions.Normal dentition.  Neck: normal, supple, no masses. Respiratory: clear to auscultation bilaterally, no wheezing, no crackles. Normal respiratory effort. No accessory muscle use.  Cardiovascular: Regular rate and rhythm, no murmurs / rubs / gallops. No extremity edema. 2+ pedal pulses. Abdomen: no  tenderness -improved after receiving pain medication per patient, no masses palpated. No hepatosplenomegaly. Bowel sounds hypoactive.  Musculoskeletal: no clubbing / cyanosis. No joint deformity upper and lower extremities. Good ROM, no contractures. Normal muscle tone.  Skin: no rashes, lesions, ulcers. No induration Neurologic: CN 2-12 grossly intact. Sensation intact. Strength 5/5 in all 4.  Psychiatric: Normal judgment and insight. Alert and oriented x 3. Normal mood.   Labs on Admission: I have personally reviewed following labs and imaging studies  CBC: Recent Labs  Lab 12/19/20 1928  WBC 14.1*  NEUTROABS 11.4*  HGB 16.1  HCT 48.6  MCV 92.6  PLT 406*   Basic Metabolic Panel: Recent Labs  Lab 12/19/20 1928  NA 134*  K 3.4*  CL 99  CO2 20*  GLUCOSE 219*  BUN 19  CREATININE 0.91  CALCIUM 10.3   GFR: CrCl cannot be calculated (Unknown ideal weight.). Liver Function Tests: Recent Labs  Lab 12/19/20 1928  AST 22  ALT 26  ALKPHOS 105  BILITOT 0.8  PROT 8.9*  ALBUMIN 4.9   Recent  Labs  Lab 12/19/20 1928  LIPASE 35   No results for input(s): AMMONIA in the last 168 hours. Coagulation Profile: No results for input(s): INR, PROTIME in the last 168 hours. Cardiac Enzymes: No results for input(s): CKTOTAL, CKMB, CKMBINDEX, TROPONINI in the last 168 hours. BNP (last 3 results) No results for input(s): PROBNP in the last 8760 hours. HbA1C: No results for input(s): HGBA1C in the last 72 hours. CBG: No results for input(s): GLUCAP in the last 168 hours. Lipid Profile: No results for input(s): CHOL, HDL, LDLCALC, TRIG, CHOLHDL, LDLDIRECT in the last 72 hours. Thyroid Function Tests: No results for input(s): TSH, T4TOTAL, FREET4, T3FREE, THYROIDAB in the last 72 hours. Anemia Panel: No results for input(s): VITAMINB12, FOLATE, FERRITIN, TIBC, IRON, RETICCTPCT in the last 72 hours. Urine analysis:    Component Value Date/Time   COLORURINE YELLOW 04/13/2017 1520    APPEARANCEUR CLEAR 04/13/2017 1520   LABSPEC 1.025 04/13/2017 1520   PHURINE 5.0 04/13/2017 1520   GLUCOSEU NEGATIVE 04/13/2017 1520   HGBUR NEGATIVE 04/13/2017 1520   BILIRUBINUR NEGATIVE 04/13/2017 1520   KETONESUR NEGATIVE 04/13/2017 1520   PROTEINUR NEGATIVE 04/13/2017 1520   NITRITE NEGATIVE 04/13/2017 1520   LEUKOCYTESUR NEGATIVE 04/13/2017 1520    Radiological Exams on Admission: CT ABDOMEN PELVIS W CONTRAST  Addendum Date: 12/19/2020   ADDENDUM REPORT: 12/19/2020 22:01 ADDENDUM: Critical findings were reported to PA Fayrene Helper at 10:01 p.m. Electronically Signed   By: Thornell Sartorius M.D.   On: 12/19/2020 22:01   Result Date: 12/19/2020 CLINICAL DATA:  Abdominal pain, nausea, vomiting. EXAM: CT ABDOMEN AND PELVIS WITH CONTRAST TECHNIQUE: Multidetector CT imaging of the abdomen and pelvis was performed using the standard protocol following bolus administration of intravenous contrast. CONTRAST:  OMNIPAQUE IOHEXOL 350 MG/ML SOLN COMPARISON:  04/13/2017. FINDINGS: Lower chest: The heart is normal in size and there is no pericardial effusion. A few scattered coronary artery calcifications are noted. Mild atelectasis or infiltrate is noted in the lower lobes bilaterally. Hepatobiliary: No focal liver abnormality is seen. No gallstones, gallbladder wall thickening, or biliary dilatation. Pancreas: Unremarkable. No pancreatic ductal dilatation or surrounding inflammatory changes. Spleen: Normal in size without focal abnormality. Adrenals/Urinary Tract: No adrenal nodule or mass. Evaluation for renal calculus is limited due to excreted contrast at the renal pyramids and collecting systems. There is no hydronephrosis. No focal renal abnormality is identified. The urinary bladder is within normal limits. Stomach/Bowel: Scattered loops of mildly distended small bowel are noted in the abdomen measuring up to 3.4 cm in diameter and air-fluid levels are noted. There is a suspected transition  point in the right lower quadrant, best seen on coronal image 96, however evaluation in this region is extremely limited due to streak artifact from a right hip prosthesis. The small bowel distal to this point is decompressed. Scattered diverticula are present along the colon without evidence of diverticulitis. A normal appendix is noted in the right lower quadrant Vascular/Lymphatic: Aortic atherosclerosis. No enlarged abdominal or pelvic lymph nodes. Reproductive: Prostate is unremarkable. Other: No free fluid. 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 Degenerative changes are present in the thoracic spine. No acute osseous abnormality is identified. IMPRESSION: Dilated loops of small bowel in the abdomen with scattered air-fluid levels, suspicious for small bowel obstruction. There is a suspected transition point in the right lower quadrant, not well evaluated due to streak hardware artifact. The small bowel is collapsed distal to this point. Surgical consultation is recommended. Electronically Signed:  By: Thornell Sartorius M.D. On: 12/19/2020 21:57    EKG: Not performed.  Assessment/Plan Principal Problem:   Small bowel obstruction (HCC) Active Problems:   Insulin dependent type 2 diabetes mellitus (HCC)   Hypertension associated with diabetes (HCC)   Hyperlipidemia associated with type 2 diabetes mellitus (HCC)   Kenneth Barron is a 55 y.o. male with medical history significant for insulin-dependent type 2 diabetes, hypertension, hyperlipidemia, asthma, depression who is admitted with small bowel obstruction.  Small bowel obstruction: CT imaging showed changes suspicious for small bowel obstruction with transition point in RLQ.  He is no longer having any active vomiting after NG tube was not placed in the ED.  He would like to avoid it but I discussed that if symptoms recur then NG tube placement is the next step. -Keep n.p.o. -Low threshold to Place NG  tube -Start maintenance IV fluids overnight -IV analgesics and antiemetics as needed  Insulin-dependent type 2 diabetes: Last hemoglobin A1c 8.7% on 12/14/2020.  Start on moderate SSI every 4 hours while NPO.  Hypertension: Holding Home antihypertensives.  IV labetalol as needed.  Leukocytosis: Likely reactive in setting of SBO.  Continue to monitor.  Hypokalemia: Mild, give IV supplement with fluids.  Hyperlipidemia: Statin held while NPO.  Depression: Sertraline held while NPO.  DVT prophylaxis: Subcutaneous heparin Code Status: Full code, confirmed with patient on admission Family Communication: Discussed with patient, he has discussed with his significant other Disposition Plan: From home, dispo pending clinical progress Consults called: General surgery Level of care: Med-Surg Admission status:  Status is: Observation  The patient remains OBS appropriate and will d/c before 2 midnights.  Darreld Mclean MD [facility]  If 7PM-7AM, please contact night-coverage [email or web address]  12/19/2020, 11:23 PM

## 2020-12-19 NOTE — ED Notes (Signed)
Attempted IV x2 without success  

## 2020-12-19 NOTE — ED Triage Notes (Signed)
Pt BIB EMS from home. Pt complains of nausea and vomiting x 2 days.

## 2020-12-19 NOTE — ED Provider Notes (Signed)
[facility]-EMERGENCY DEPT Provider Note   CSN: [id number] Arrival date & time: 12/19/20  1858     History Chief Complaint  Patient presents with   Nausea   Vomiting    Kenneth Barron is a 55 y.o. male.  The history is provided by the patient and medical records. No language interpreter was used.   55 year old male significant history of diabetes, hypertension, asthma, brought here via EMS from home for evaluation of abdominal discomfort.  Patient developed diffuse abdominal pain described as a tightness bloatedness sensation throughout the day today.  He is also endorsed feeling nauseous, and having dry heaves.  He endorsed loose stools.  Mild urinary discomfort.  Endorsed chills, fatigue, and overall not feeling well.  He denies having headache, runny nose sneezing coughing sore throat.  He has history of diverticulitis in the past, report eating quite a bit of Trail mix yesterday and unsure if it may have contributed to his symptoms.  He denies any recent sick contact.  Past Medical History:  Diagnosis Date   Arthritis    Asthma    Diabetes mellitus without complication (HCC)    Type 2   Hypertension    Pneumonia     Patient Active Problem List   Diagnosis Date Noted   Complete rotator cuff tear 07/23/2020    Past Surgical History:  Procedure Laterality Date   HIP SURGERY     2 surgeries   ROTATOR CUFF REPAIR Bilateral    SHOULDER ARTHROSCOPY WITH ROTATOR CUFF REPAIR AND SUBACROMIAL DECOMPRESSION Right 07/23/2020   Procedure: Right shoulder arthroscopy revision rotator cuff repair, subacromial decompression, distal clavicle resection, possible patch graft;  Surgeon: Jene Every, MD;  Location: [facility];  Service: Orthopedics;  Laterality: Right;        Family History  Problem Relation Age of Onset   Diabetes Mother    Diabetes Father     Social History   Tobacco Use   Smoking status: Never   Smokeless tobacco: Never  Vaping  Use   Vaping Use: Never used  Substance Use Topics   Alcohol use: Never   Drug use: Yes    Types: Marijuana    Home Medications Prior to Admission medications   Medication Sig Start Date End Date Taking? Authorizing Provider  alum & mag hydroxide-simeth (MAALOX PLUS) 400-400-40 MG/5ML suspension Take 15 mLs by mouth every 6 (six) hours as needed for indigestion. 01/12/20   Wieters, Hallie C, PA-C  amLODipine (NORVASC) 10 MG tablet Take 10 mg by mouth daily. 02/13/17   [provider]  ammonium lactate (LAC-HYDRIN) 12 % lotion Apply 1 application topically daily.    [provider]  aspirin EC 81 MG tablet Take 1 tablet (81 mg total) by mouth daily. Day after surgery 07/23/20   Jene Every, MD  atorvastatin (LIPITOR) 20 MG tablet Take 20 mg by mouth daily. 03/10/17   [provider]  docusate sodium (COLACE) 100 MG capsule Take 1 capsule (100 mg total) by mouth 2 (two) times daily as needed for mild constipation. 07/23/20   Jene Every, MD  insulin degludec (TRESIBA) 100 UNIT/ML SOPN FlexTouch Pen Inject 46 Units into the skin daily. 11/09/16 01/31/22  [provider]  lisinopril-hydrochlorothiazide (PRINZIDE,ZESTORETIC) 20-25 MG tablet Take 1 tablet by mouth daily. 01/11/17   [provider]  methocarbamol (ROBAXIN) 500 MG tablet Take 1 tablet (500 mg total) by mouth every 8 (eight) hours as needed for muscle spasms. 07/23/20   Jene Every,  MD  metoprolol tartrate (LOPRESSOR) 25 MG tablet Take 25 mg by mouth 2 (two) times daily. 04/23/19   [provider]  polyethylene glycol (MIRALAX / GLYCOLAX) 17 g packet Take 17 g by mouth daily. 07/23/20   Jene Every, MD  Semaglutide,0.25 or 0.5MG /DOS, (OZEMPIC, 0.25 OR 0.5 MG/DOSE,) 2 MG/1.5ML SOPN Inject 0.5 mg into the skin once a week.    [provider]  sertraline (ZOLOFT) 50 MG tablet Take 50 mg by mouth daily. 03/10/17   [provider]  vitamin B-12 (CYANOCOBALAMIN) 1000 MCG  tablet Take 1,000 mcg by mouth daily.    [provider]    Allergies    Mixed feathers, Pollen extract, Other, and Metformin and related  Review of Systems   Review of Systems  All other systems reviewed and are negative.  Physical Exam Updated Vital Signs BP (!) 159/112   Pulse 75   Temp 98.1 F (36.7 C) (Oral)   Resp 20   SpO2 97%   Physical Exam Vitals and nursing note reviewed.  Constitutional:      General: He is not in acute distress.    Appearance: He is well-developed. He is obese.  HENT:     Head: Atraumatic.  Eyes:     Conjunctiva/sclera: Conjunctivae normal.  Cardiovascular:     Rate and Rhythm: Normal rate and regular rhythm.     Pulses: Normal pulses.     Heart sounds: Normal heart sounds.  Pulmonary:     Effort: Pulmonary effort is normal.     Breath sounds: Normal breath sounds.  Abdominal:     Palpations: Abdomen is soft.     Tenderness: There is abdominal tenderness (Mild diffuse abdominal tenderness without guarding or rebound tenderness.  Negative Murphy sign, no pain at McBurney's point.).  Musculoskeletal:     Cervical back: Neck supple.  Skin:    Findings: No rash.  Neurological:     Mental Status: He is alert. Mental status is at baseline.  Psychiatric:        Mood and Affect: Mood normal.    ED Results / Procedures / Treatments   Labs (all labs ordered are listed, but only abnormal results are displayed) Labs Reviewed  CBC WITH DIFFERENTIAL/PLATELET - Abnormal; Notable for the following components:      Result Value   WBC 14.1 (*)    Platelets 406 (*)    Neutro Abs 11.4 (*)    Abs Immature Granulocytes 0.08 (*)    All other components within normal limits  COMPREHENSIVE METABOLIC PANEL - Abnormal; Notable for the following components:   Sodium 134 (*)    Potassium 3.4 (*)    CO2 20 (*)    Glucose, Bld 219 (*)    Total Protein 8.9 (*)    All other components within normal limits  RESP PANEL BY RT-PCR (FLU A&B, COVID)  ARPGX2  LIPASE, BLOOD  URINALYSIS, ROUTINE W REFLEX MICROSCOPIC    EKG None  Radiology CT ABDOMEN PELVIS W CONTRAST  Result Date: 12/19/2020 CLINICAL DATA:  Abdominal pain, nausea, vomiting. EXAM: CT ABDOMEN AND PELVIS WITH CONTRAST TECHNIQUE: Multidetector CT imaging of the abdomen and pelvis was performed using the standard protocol following bolus administration of intravenous contrast. CONTRAST:  OMNIPAQUE IOHEXOL 350 MG/ML SOLN COMPARISON:  04/13/2017. FINDINGS: Lower chest: The heart is normal in size and there is no pericardial effusion. A few scattered coronary artery calcifications are noted. Mild atelectasis or infiltrate is noted in the lower lobes bilaterally.  Hepatobiliary: No focal liver abnormality is seen. No gallstones, gallbladder wall thickening, or biliary dilatation. Pancreas: Unremarkable. No pancreatic ductal dilatation or surrounding inflammatory changes. Spleen: Normal in size without focal abnormality. Adrenals/Urinary Tract: No adrenal nodule or mass. Evaluation for renal calculus is limited due to excreted contrast at the renal pyramids and collecting systems. There is no hydronephrosis. No focal renal abnormality is identified. The urinary bladder is within normal limits. Stomach/Bowel: Scattered loops of mildly distended small bowel are noted in the abdomen measuring up to 3.4 cm in diameter and air-fluid levels are noted. There is a suspected transition point in the right lower quadrant, best seen on coronal image 96, however evaluation in this region is extremely limited due to streak artifact from a right hip prosthesis. The small bowel distal to this point is decompressed. Scattered diverticula are present along the colon without evidence of diverticulitis. A normal appendix is noted in the right lower quadrant Vascular/Lymphatic: Aortic atherosclerosis. No enlarged abdominal or pelvic lymph nodes. Reproductive: Prostate is unremarkable. Other: No free fluid.  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 Degenerative changes are present in the thoracic spine. No acute osseous abnormality is identified. IMPRESSION: Dilated loops of small bowel in the abdomen with scattered air-fluid levels, suspicious for small bowel obstruction. There is a suspected transition point in the right lower quadrant, not well evaluated due to streak hardware artifact. The small bowel is collapsed distal to this point. Surgical consultation is recommended. Electronically Signed   By: Thornell Sartorius M.D.   On: 12/19/2020 21:57    Procedures Procedures   Medications Ordered in ED Medications  ondansetron (ZOFRAN-ODT) disintegrating tablet 4 mg (4 mg Oral Given 12/19/20 1923)  morphine 4 MG/ML injection 4 mg (4 mg Intravenous Given 12/19/20 2047)  iohexol (OMNIPAQUE) 350 MG/ML injection 80 mL (100 mLs Intravenous Contrast Given 12/19/20 2117)    ED Course  I have reviewed the triage vital signs and the nursing notes.  Pertinent labs & imaging results that were available during my care of the patient were reviewed by me and considered in my medical decision making (see chart for details).    MDM Rules/Calculators/A&P                           BP (!) 187/119 (BP Location: Left Arm)   Pulse 81   Temp 98.1 F (36.7 C) (Oral)   Resp 16   SpO2 92%   Final Clinical Impression(s) / ED Diagnoses Final diagnoses:  SBO (small bowel obstruction) (HCC)    Rx / DC Orders ED Discharge Orders     None      7:51 PM Patient here with abdominal discomfort, nauseous, dry heaving, and states he has history of diverticular disease in the past and this felt similar.  Work-up initiated.  Viral respiratory panel ordered.  10:05 PM Radiologist notified the patient has finding concerning for SBO.  Patient did report a remote history of SBO improved with NG tube and hospitalization.  No prior abdominal surgeries.  Appreciate consultation from on-call  surgeon Dr. Carolynne Edouard who request for medicine admission and that he will be involved in patient care.  At this time patient is not actively vomiting, will hold off on NG tube. Pt sign out to oncoming team who will call for admission.  Pt is aware of plan.    Fayrene Helper, PA-C 12/19/20 2213    Tegeler, Canary Brim, MD 12/19/20 2214

## 2020-12-19 NOTE — ED Provider Notes (Signed)
  Physical Exam  BP (!) 187/119 (BP Location: Left Arm)   Pulse 81   Temp 98.1 F (36.7 C) (Oral)   Resp 16   SpO2 92%   Physical Exam  ED Course/Procedures   Clinical Course as of 12/19/20 2254  Sun Dec 19, 2020  2233 Consult to Dr. Allena Katz, hospitalist, who is agreeable to seeing this patient and admitting him to his service. I appreciate his collaboration in the care of this patient.  [RS]    Clinical Course User Index [RS] Orlie Cundari, Eugene Gavia, PA-C    Procedures  MDM    Care of this patient assumed from preceding ED provider Vickey Huger, PA-C at time of shift change.  Please see his associated note for further insight into the patient's ED course.  In brief, patient is a 55 year old male with remote history of SBO who presents today with abdominal discomfort, tightness, and bloating sensation.  Nausea without vomiting as well as loose stools.  CBC with leukocytosis of 14,000, CMP with mild hyponatremia 134, hypokalemia of 3.4, and hyperglycemia of 219.  COVID test is negative and lipase is normal.  UA to be collected.  CT of the abdomen pelvis with contrast was obtained which did reveal small bowel obstruction.  Preceding ED provider did contact general surgery, Dr. Carolynne Edouard, who agrees with plan for NG tube decompression and medical admission.  He will follow the patient on the inpatient service tomorrow morning.  At time of shift change awaiting call for admission from hospitalist service at this time.  Consult to Dr. Allena Katz as above, patient admitted to his service. Hiroki voiced understanding of his medical evaluation and treatment plan. Each of his questions was answered to his expressed satisfaction. He is amenable to plan for admission at this time.   This chart was dictated using voice recognition software, Dragon. Despite the best efforts of this provider to proofread and correct errors, errors may still occur which can change documentation meaning.      Paris Lore, PA-C 12/19/20 2255    Mancel Bale, MD 12/20/20 1234

## 2020-12-20 ENCOUNTER — Observation Stay (HOSPITAL_COMMUNITY): Payer: Federal, State, Local not specified - PPO

## 2020-12-20 DIAGNOSIS — I152 Hypertension secondary to endocrine disorders: Secondary | ICD-10-CM

## 2020-12-20 DIAGNOSIS — E1159 Type 2 diabetes mellitus with other circulatory complications: Secondary | ICD-10-CM

## 2020-12-20 DIAGNOSIS — E1169 Type 2 diabetes mellitus with other specified complication: Secondary | ICD-10-CM

## 2020-12-20 DIAGNOSIS — F32A Depression, unspecified: Secondary | ICD-10-CM | POA: Diagnosis not present

## 2020-12-20 DIAGNOSIS — E119 Type 2 diabetes mellitus without complications: Secondary | ICD-10-CM

## 2020-12-20 DIAGNOSIS — K56609 Unspecified intestinal obstruction, unspecified as to partial versus complete obstruction: Secondary | ICD-10-CM | POA: Diagnosis not present

## 2020-12-20 DIAGNOSIS — Z794 Long term (current) use of insulin: Secondary | ICD-10-CM

## 2020-12-20 DIAGNOSIS — E876 Hypokalemia: Secondary | ICD-10-CM

## 2020-12-20 DIAGNOSIS — E785 Hyperlipidemia, unspecified: Secondary | ICD-10-CM

## 2020-12-20 LAB — BASIC METABOLIC PANEL
Anion gap: 11 (ref 5–15)
BUN: 20 mg/dL (ref 6–20)
CO2: 25 mmol/L (ref 22–32)
Calcium: 9 mg/dL (ref 8.9–10.3)
Chloride: 101 mmol/L (ref 98–111)
Creatinine, Ser: 0.91 mg/dL (ref 0.61–1.24)
GFR, Estimated: >60 mL/min (ref >60)
Glucose, Bld: 165 mg/dL — ABNORMAL HIGH (ref 70–99)
Potassium: 5 mmol/L (ref 3.5–5.1)
Sodium: 137 mmol/L (ref 135–145)

## 2020-12-20 LAB — CBC
HCT: 44.1 % (ref 39.0–52.0)
Hemoglobin: 14.5 g/dL (ref 13.0–17.0)
MCH: 30.8 pg (ref 26.0–34.0)
MCHC: 32.9 g/dL (ref 30.0–36.0)
MCV: 93.6 fL (ref 80.0–100.0)
Platelets: 363 10*3/uL (ref 150–400)
RBC: 4.71 MIL/uL (ref 4.22–5.81)
RDW: 13.6 % (ref 11.5–15.5)
WBC: 9.9 10*3/uL (ref 4.0–10.5)
nRBC: 0 % (ref 0.0–0.2)

## 2020-12-20 LAB — GLUCOSE, CAPILLARY
Glucose-Capillary: 108 mg/dL — ABNORMAL HIGH (ref 70–99)
Glucose-Capillary: 124 mg/dL — ABNORMAL HIGH (ref 70–99)
Glucose-Capillary: 140 mg/dL — ABNORMAL HIGH (ref 70–99)
Glucose-Capillary: 156 mg/dL — ABNORMAL HIGH (ref 70–99)
Glucose-Capillary: 157 mg/dL — ABNORMAL HIGH (ref 70–99)
Glucose-Capillary: 162 mg/dL — ABNORMAL HIGH (ref 70–99)

## 2020-12-20 LAB — HIV ANTIBODY (ROUTINE TESTING W REFLEX): HIV Screen 4th Generation wRfx: NONREACTIVE

## 2020-12-20 LAB — MAGNESIUM: Magnesium: 1.8 mg/dL (ref 1.7–2.4)

## 2020-12-20 MED ORDER — HYDRALAZINE HCL 20 MG/ML IJ SOLN
10.0000 mg | Freq: Four times a day (QID) | INTRAMUSCULAR | Status: DC | PRN
  Administered 2020-12-20 – 2020-12-21 (×3): 10 mg via INTRAVENOUS
  Filled 2020-12-20 (×3): qty 1

## 2020-12-20 MED ORDER — DIATRIZOATE MEGLUMINE & SODIUM 66-10 % PO SOLN
90.0000 mL | Freq: Once | ORAL | Status: AC
  Administered 2020-12-20: 90 mL via ORAL
  Filled 2020-12-20: qty 90

## 2020-12-20 MED ORDER — SODIUM CHLORIDE 0.45 % IV SOLN
INTRAVENOUS | Status: DC
Start: 2020-12-20 — End: 2020-12-22

## 2020-12-20 NOTE — Assessment & Plan Note (Signed)
Given supplementation. Resolved. °

## 2020-12-20 NOTE — Progress Notes (Addendum)
PROGRESS NOTE    Kenneth Barron  ZGY:[id number] DOB: [birth year] DOA: 12/19/2020 PCP: Malka So., MD   Brief Narrative: Kenneth Barron is a 55 y.o. male with a history of diabetes mellitus type 2, hypertension, hyperlipidemia, asthma, depression. Patient presented secondary to nausea/vomiting/abdominal pain with CT evidence of small bowel obstruction. NPO and general surgery consulted. NG tube deferred.   Assessment & Plan:   * Small bowel obstruction (HCC) CT abdomen/pelvis significant for dilated loops of small bowel concerning for SBO with possible transition point in RLQ. NPO on midnight. NG tube deferred. General surgery consulted on admission. -General surgery recommendations: SB protocol, ambulation  Depression Patient is on Zoloft as an outpatient. Held secondary to NPO status.  Hyperlipidemia associated with type 2 diabetes mellitus (HCC) On Lipitor as an outpatient which is held secondary to NPO status.  Hypertension associated with diabetes (HCC) Patient is on amlodipine, metformin, lisinopril, hydrochlorothiazide as an outpatient which have been held on admission secondary to NPO status.  Insulin dependent type 2 diabetes mellitus [facility]) Patient is on [place] and Ozempic as an outpatient which is held on admission. Good control with SSI only at this time. -Continue SSI -Since type 2, will hold long acting while NPO  Hypokalemia-resolved as of 12/20/2020 Given supplementation. Resolved.    DVT prophylaxis: Heparin subq Code Status:   Code Status: Full Code Family Communication: None at bedside Disposition Plan: Discharge home likely in 2-3 days   Consultants:  General surgery  Procedures:  None  Antimicrobials: None    Subjective: Abdominal pain, nausea, vomiting improved. Some flatus. No bowel movement.  Objective: Vitals:   12/20/20 0408 12/20/20 0826 12/20/20 1322 12/20/20 1428  BP: (!) 163/87 (!) 156/99 (!) 160/87 (!) 172/101   Pulse: 71 69 (!) 54 74  Resp: 18 18 16 18   Temp: 98.4 F (36.9 C) 98.2 F (36.8 C) 97.7 F (36.5 C) 99.2 F (37.3 C)  TempSrc: Oral Oral Oral Oral  SpO2: 96% 96% 99% 98%  Weight: (!) 142.9 kg     Height: 5\' 11"  (1.803 m)       Intake/Output Summary (Last 24 hours) at 12/20/2020 1431 Last data filed at 12/20/2020 0700 Gross per 24 hour  Intake --  Output 150 ml  Net -150 ml   Filed Weights   12/20/20 0408  Weight: (!) 142.9 kg    Examination:  General exam: Appears calm and comfortable Respiratory system: Clear to auscultation. Respiratory effort normal. Cardiovascular system: S1 & S2 heard, RRR. Gastrointestinal system: Abdomen is distended, soft and mildly tender. No organomegaly or masses felt. Normal bowel sounds heard. Central nervous system: Alert and oriented. No focal neurological deficits. Musculoskeletal: No edema. No calf tenderness Skin: No cyanosis. No rashes Psychiatry: Judgement and insight appear normal. Mood & affect appropriate.     Data Reviewed: I have personally reviewed following labs and imaging studies  CBC Lab Results  Component Value Date   WBC 9.9 12/20/2020   RBC 4.71 12/20/2020   HGB 14.5 12/20/2020   HCT 44.1 12/20/2020   MCV 93.6 12/20/2020   MCH 30.8 12/20/2020   PLT 363 12/20/2020   MCHC 32.9 12/20/2020   RDW 13.6 12/20/2020   LYMPHSABS 2.0 12/19/2020   MONOABS 0.5 12/19/2020   EOSABS 0.1 12/19/2020   BASOSABS 0.0 12/19/2020     Last metabolic panel Lab Results  Component Value Date   NA 137 12/20/2020   K 5.0 12/20/2020   CL 101 12/20/2020   CO2 25 12/20/2020  BUN 20 12/20/2020   CREATININE 0.91 12/20/2020   GLUCOSE 165 (H) 12/20/2020   GFRNONAA >60 12/20/2020   GFRAA >60 04/13/2017   CALCIUM 9.0 12/20/2020   PROT 8.9 (H) 12/19/2020   ALBUMIN 4.9 12/19/2020   BILITOT 0.8 12/19/2020   ALKPHOS 105 12/19/2020   AST 22 12/19/2020   ALT 26 12/19/2020   ANIONGAP 11 12/20/2020    CBG (last 3)  Recent Labs     12/20/20 0409 12/20/20 0802 12/20/20 1129  GLUCAP 157* 156* 140*     GFR: Estimated Creatinine Clearance: 132.7 mL/min (by C-G formula based on SCr of 0.91 mg/dL).  Coagulation Profile: No results for input(s): INR, PROTIME in the last 168 hours.  Recent Results (from the past 240 hour(s))  Resp Panel by RT-PCR (Flu A&B, Covid) Nasopharyngeal Swab     Status: None   Collection Time: 12/19/20  7:05 PM   Specimen: Nasopharyngeal Swab; Nasopharyngeal(NP) swabs in vial transport medium  Result Value Ref Range Status   SARS Coronavirus 2 by RT PCR NEGATIVE NEGATIVE Final    Comment: (NOTE) SARS-CoV-2 target nucleic acids are NOT DETECTED.  The SARS-CoV-2 RNA is generally detectable in upper respiratory specimens during the acute phase of infection. The lowest concentration of SARS-CoV-2 viral copies this assay can detect is 138 copies/mL. A negative result does not preclude SARS-Cov-2 infection and should not be used as the sole basis for treatment or other patient management decisions. A negative result may occur with  improper specimen collection/handling, submission of specimen other than nasopharyngeal swab, presence of viral mutation(s) within the areas targeted by this assay, and inadequate number of viral copies(<138 copies/mL). A negative result must be combined with clinical observations, patient history, and epidemiological information. The expected result is Negative.  Fact Sheet for Patients:  BloggerCourse.com  Fact Sheet for Healthcare Providers:  SeriousBroker.it  This test is no t yet approved or cleared by the [place] FDA and  has been authorized for detection and/or diagnosis of SARS-CoV-2 by FDA under an Emergency Use Authorization (EUA). This EUA will remain  in effect (meaning this test can be used) for the duration of the COVID-19 declaration under Section 564(b)(1) of the Act, 21 U.S.C.section  360bbb-3(b)(1), unless the authorization is terminated  or revoked sooner.       Influenza A by PCR NEGATIVE NEGATIVE Final   Influenza B by PCR NEGATIVE NEGATIVE Final    Comment: (NOTE) The Xpert Xpress SARS-CoV-2/FLU/RSV plus assay is intended as an aid in the diagnosis of influenza from Nasopharyngeal swab specimens and should not be used as a sole basis for treatment. Nasal washings and aspirates are unacceptable for Xpert Xpress SARS-CoV-2/FLU/RSV testing.  Fact Sheet for Patients: BloggerCourse.com  Fact Sheet for Healthcare Providers: SeriousBroker.it  This test is not yet approved or cleared by the [place] FDA and has been authorized for detection and/or diagnosis of SARS-CoV-2 by FDA under an Emergency Use Authorization (EUA). This EUA will remain in effect (meaning this test can be used) for the duration of the COVID-19 declaration under Section 564(b)(1) of the Act, 21 U.S.C. section 360bbb-3(b)(1), unless the authorization is terminated or revoked.  Performed at [facility], [address] [place], [place] [address]         Radiology Studies: CT ABDOMEN PELVIS W CONTRAST  Addendum Date: 12/19/2020   ADDENDUM REPORT: 12/19/2020 22:01 ADDENDUM: Critical findings were reported to PA Fayrene Helper at 10:01 p.m. Electronically Signed   By: Thornell Sartorius  M.D.   On: 12/19/2020 22:01   Result Date: 12/19/2020 CLINICAL DATA:  Abdominal pain, nausea, vomiting. EXAM: CT ABDOMEN AND PELVIS WITH CONTRAST TECHNIQUE: Multidetector CT imaging of the abdomen and pelvis was performed using the standard protocol following bolus administration of intravenous contrast. CONTRAST:  OMNIPAQUE IOHEXOL 350 MG/ML SOLN COMPARISON:  04/13/2017. FINDINGS: Lower chest: The heart is normal in size and there is no pericardial effusion. A few scattered coronary artery calcifications are noted. Mild atelectasis or  infiltrate is noted in the lower lobes bilaterally. Hepatobiliary: No focal liver abnormality is seen. No gallstones, gallbladder wall thickening, or biliary dilatation. Pancreas: Unremarkable. No pancreatic ductal dilatation or surrounding inflammatory changes. Spleen: Normal in size without focal abnormality. Adrenals/Urinary Tract: No adrenal nodule or mass. Evaluation for renal calculus is limited due to excreted contrast at the renal pyramids and collecting systems. There is no hydronephrosis. No focal renal abnormality is identified. The urinary bladder is within normal limits. Stomach/Bowel: Scattered loops of mildly distended small bowel are noted in the abdomen measuring up to 3.4 cm in diameter and air-fluid levels are noted. There is a suspected transition point in the right lower quadrant, best seen on coronal image 96, however evaluation in this region is extremely limited due to streak artifact from a right hip prosthesis. The small bowel distal to this point is decompressed. Scattered diverticula are present along the colon without evidence of diverticulitis. A normal appendix is noted in the right lower quadrant Vascular/Lymphatic: Aortic atherosclerosis. No enlarged abdominal or pelvic lymph nodes. Reproductive: Prostate is unremarkable. Other: No free fluid. 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 Degenerative changes are present in the thoracic spine. No acute osseous abnormality is identified. IMPRESSION: Dilated loops of small bowel in the abdomen with scattered air-fluid levels, suspicious for small bowel obstruction. There is a suspected transition point in the right lower quadrant, not well evaluated due to streak hardware artifact. The small bowel is collapsed distal to this point. Surgical consultation is recommended. Electronically Signed: By: Thornell Sartorius M.D. On: 12/19/2020 21:57        Scheduled Meds:  heparin  5,000 Units Subcutaneous Q8H    insulin aspart  0-15 Units Subcutaneous Q4H   sodium chloride flush  3 mL Intravenous Q12H   Continuous Infusions:   LOS: 0 days     Jacquelin Hawking, MD [facility] 12/20/2020, 2:31 PM  If 7PM-7AM, please contact night-coverage [email or web address]

## 2020-12-20 NOTE — Assessment & Plan Note (Signed)
Patient is on Zoloft as an outpatient. Held secondary to NPO status.

## 2020-12-20 NOTE — Assessment & Plan Note (Addendum)
Patient is on [place] and Ozempic as an outpatient which is held on admission. Good control with SSI only at this time. -Continue SSI -Since type 2 and controlled well enough, will hold long acting while NPO

## 2020-12-20 NOTE — Assessment & Plan Note (Addendum)
CT abdomen/pelvis significant for dilated loops of small bowel concerning for SBO with possible transition point in RLQ. NPO on midnight. NG tube deferred. General surgery consulted on admission. Abdominal x-ray (11/15) with persistent obstructive pattern -General surgery recommendations: ambulation, possible need for NG tube

## 2020-12-20 NOTE — Assessment & Plan Note (Addendum)
Patient is on amlodipine, metformin, lisinopril, hydrochlorothiazide as an outpatient which have been held on admission secondary to NPO status. Currently uncontrolled. -Start hydralazine IV q8 hours

## 2020-12-20 NOTE — Plan of Care (Signed)
  Problem: Pain Managment: Goal: General experience of comfort will improve Outcome: Progressing   

## 2020-12-20 NOTE — Assessment & Plan Note (Signed)
On Lipitor as an outpatient which is held secondary to NPO status.

## 2020-12-20 NOTE — Hospital Course (Addendum)
Kenneth Barron is a 55 y.o. male with a history of diabetes mellitus type 2, hypertension, hyperlipidemia, asthma, depression. Patient presented secondary to nausea/vomiting/abdominal pain with CT evidence of small bowel obstruction. NPO and general surgery consulted. NG tube deferred for now but patient with worsened nausea with vomiting. No flatus or bowel movement.

## 2020-12-20 NOTE — Consult Note (Signed)
[facility] Surgery Consult Note  Raman Featherston Jul 11, 1965  [id number].    Requesting MD: Darreld Mclean Chief Complaint/Reason for Consult: SBO  HPI:  Kenneth Barron is a 55yo male PMH HTN, HLD, DM who presented to [facility] yesterday complaining of worsening abdominal pain. States that the pain started early yesterday morning, it woke him from sleep. Pain is mostly in his right abdomen and lower abdomen. Associated with multiple episodes of nausea and vomiting. Able to keep a few sips of water down, otherwise unable to tolerate PO. He did have a BM yesterday. States that he ate a lot of trail mix on Saturday, and wonders if that was the cause of his obstruction. He does report a h/o two prior SBOs, both over 10 years ago. One resolved without intervention and the other resolved after NG tube decompression. CT scan yesterday showed dilated loops of small bowel in the abdomen with scattered air-fluid levels suspicious for small bowel obstruction with a suspected transition point in the right lower quadrant. Patient was admitted to the medical service. General surgery asked to see. Since admission his pain has nearly resolved and he has started passing a little flatus.   Abdominal surgical history: none Anticoagulants: none Denies tobacco or alcohol use Smokes marijuana, otherwise denies illicit drug use Employment: works for the [employer] - currently out since shoulder surgery in June 2022  Review of Systems  Gastrointestinal:  Positive for abdominal pain, constipation, nausea and vomiting.   All systems reviewed and otherwise negative except for as above  Family History  Problem Relation Age of Onset   Diabetes Mother    Diabetes Father     Past Medical History:  Diagnosis Date   Arthritis    Asthma    Diabetes mellitus without complication (HCC)    Type 2   Hypertension    Pneumonia     Past Surgical History:  Procedure Laterality Date   HIP SURGERY     2  surgeries   ROTATOR CUFF REPAIR Bilateral    SHOULDER ARTHROSCOPY WITH ROTATOR CUFF REPAIR AND SUBACROMIAL DECOMPRESSION Right 07/23/2020   Procedure: Right shoulder arthroscopy revision rotator cuff repair, subacromial decompression, distal clavicle resection, possible patch graft;  Surgeon: Jene Every, MD;  Location: [facility] OR[facility];  Service: Orthopedics;  Laterality: Right;     Social History:  reports that he has never smoked. He has never used smokeless tobacco. He reports current drug use. Drug: Marijuana. He reports that he does not drink alcohol.  Allergies:  Allergies  Allergen Reactions   Mixed Feathers Shortness Of Breath   Pollen Extract Shortness Of Breath   Other     Refuse blood products   Metformin And Related Diarrhea and Nausea And Vomiting    Medications Prior to Admission  Medication Sig Dispense Refill   amLODipine (NORVASC) 10 MG tablet Take 10 mg by mouth daily.  2   ammonium lactate (LAC-HYDRIN) 12 % lotion Apply 1 application topically daily.     atorvastatin (LIPITOR) 80 MG tablet Take 80 mg by mouth daily.     insulin degludec (TRESIBA) 200 UNIT/ML FlexTouch Pen Inject 46 Units into the skin daily.     lisinopril-hydrochlorothiazide (PRINZIDE,ZESTORETIC) 20-25 MG tablet Take 1 tablet by mouth daily.  1   metoprolol tartrate (LOPRESSOR) 25 MG tablet Take 25 mg by mouth 2 (two) times daily.     Semaglutide,0.25 or 0.5MG /DOS, (OZEMPIC, 0.25 OR 0.5 MG/DOSE,) 2 MG/1.5ML SOPN Inject 0.5 mg into the skin once a week.  Sunday     sertraline (ZOLOFT) 50 MG tablet Take 50 mg by mouth daily.     alum & mag hydroxide-simeth (MAALOX PLUS) 400-400-40 MG/5ML suspension Take 15 mLs by mouth every 6 (six) hours as needed for indigestion. 355 mL 0   aspirin EC 81 MG tablet Take 1 tablet (81 mg total) by mouth daily. Day after surgery 21 tablet 1   docusate sodium (COLACE) 100 MG capsule Take 1 capsule (100 mg total) by mouth 2 (two) times daily as needed for mild constipation.  30 capsule 1   methocarbamol (ROBAXIN) 500 MG tablet Take 1 tablet (500 mg total) by mouth every 8 (eight) hours as needed for muscle spasms. 40 tablet 1   polyethylene glycol (MIRALAX / GLYCOLAX) 17 g packet Take 17 g by mouth daily. 14 each 0   vitamin B-12 (CYANOCOBALAMIN) 1000 MCG tablet Take 1,000 mcg by mouth daily. (Patient not taking: Reported on 12/19/2020)      Prior to Admission medications   Medication Sig Start Date End Date Taking? Authorizing Provider  amLODipine (NORVASC) 10 MG tablet Take 10 mg by mouth daily. 02/13/17  Yes [provider]  ammonium lactate (LAC-HYDRIN) 12 % lotion Apply 1 application topically daily.   Yes [provider]  atorvastatin (LIPITOR) 80 MG tablet Take 80 mg by mouth daily. 03/10/17  Yes [provider]  insulin degludec (TRESIBA) 200 UNIT/ML FlexTouch Pen Inject 46 Units into the skin daily. 11/09/16 01/31/22 Yes [provider]  lisinopril-hydrochlorothiazide (PRINZIDE,ZESTORETIC) 20-25 MG tablet Take 1 tablet by mouth daily. 01/11/17  Yes [provider]  metoprolol tartrate (LOPRESSOR) 25 MG tablet Take 25 mg by mouth 2 (two) times daily. 04/23/19  Yes [provider]  Semaglutide,0.25 or 0.5MG /DOS, (OZEMPIC, 0.25 OR 0.5 MG/DOSE,) 2 MG/1.5ML SOPN Inject 0.5 mg into the skin once a week. Sunday   Yes [provider]  sertraline (ZOLOFT) 50 MG tablet Take 50 mg by mouth daily. 03/10/17  Yes [provider]  alum & mag hydroxide-simeth (MAALOX PLUS) 400-400-40 MG/5ML suspension Take 15 mLs by mouth every 6 (six) hours as needed for indigestion. 01/12/20   Wieters, Hallie C, PA-C  aspirin EC 81 MG tablet Take 1 tablet (81 mg total) by mouth daily. Day after surgery 07/23/20   Jene Every, MD  docusate sodium (COLACE) 100 MG capsule Take 1 capsule (100 mg total) by mouth 2 (two) times daily as needed for mild constipation. 07/23/20   Jene Every, MD  methocarbamol (ROBAXIN) 500 MG tablet  Take 1 tablet (500 mg total) by mouth every 8 (eight) hours as needed for muscle spasms. 07/23/20   Jene Every, MD  polyethylene glycol (MIRALAX / GLYCOLAX) 17 g packet Take 17 g by mouth daily. 07/23/20   Jene Every, MD  vitamin B-12 (CYANOCOBALAMIN) 1000 MCG tablet Take 1,000 mcg by mouth daily. Patient not taking: Reported on 12/19/2020    [provider]    Blood pressure (!) 156/99, pulse 69, temperature 98.2 F (36.8 C), temperature source Oral, resp. rate 18, height 5\' 11"  (1.803 m), weight (!) 142.9 kg, SpO2 96 %. Physical Exam: General: pleasant, WD/WN male who is laying in bed in NAD HEENT: head is normocephalic, atraumatic.  Sclera are noninjected.  Pupils equal and round.  Ears and nose without any masses or lesions.  Mouth is pink and moist. Dentition fair Heart: regular, rate, and rhythm.  Normal s1,s2. No obvious murmurs, gallops, or rubs noted.  Palpable pedal pulses bilaterally  Lungs: CTAB, no wheezes, rhonchi, or rales noted.  Respiratory effort nonlabored Abd: soft, mild distension, nontender, few BS heard, no masses, hernias, or organomegaly MS: no BUE/BLE edema, calves soft and nontender Skin: warm and dry with no masses, lesions, or rashes Psych: A&Ox4 with an appropriate affect Neuro: cranial nerves grossly intact, equal strength in BUE/BLE bilaterally, normal speech, thought process intact  Results for orders placed or performed during the hospital encounter of 12/19/20 (from the past 48 hour(s))  Resp Panel by RT-PCR (Flu A&B, Covid) Nasopharyngeal Swab     Status: None   Collection Time: 12/19/20  7:05 PM   Specimen: Nasopharyngeal Swab; Nasopharyngeal(NP) swabs in vial transport medium  Result Value Ref Range   SARS Coronavirus 2 by RT PCR NEGATIVE NEGATIVE    Comment: (NOTE) SARS-CoV-2 target nucleic acids are NOT DETECTED.  The SARS-CoV-2 RNA is generally detectable in upper respiratory specimens during the acute phase of infection. The  lowest concentration of SARS-CoV-2 viral copies this assay can detect is 138 copies/mL. A negative result does not preclude SARS-Cov-2 infection and should not be used as the sole basis for treatment or other patient management decisions. A negative result may occur with  improper specimen collection/handling, submission of specimen other than nasopharyngeal swab, presence of viral mutation(s) within the areas targeted by this assay, and inadequate number of viral copies(<138 copies/mL). A negative result must be combined with clinical observations, patient history, and epidemiological information. The expected result is Negative.  Fact Sheet for Patients:  BloggerCourse.com  Fact Sheet for Healthcare Providers:  SeriousBroker.it  This test is no t yet approved or cleared by the [place] FDA and  has been authorized for detection and/or diagnosis of SARS-CoV-2 by FDA under an Emergency Use Authorization (EUA). This EUA will remain  in effect (meaning this test can be used) for the duration of the COVID-19 declaration under Section 564(b)(1) of the Act, 21 U.S.C.section 360bbb-3(b)(1), unless the authorization is terminated  or revoked sooner.       Influenza A by PCR NEGATIVE NEGATIVE   Influenza B by PCR NEGATIVE NEGATIVE    Comment: (NOTE) The Xpert Xpress SARS-CoV-2/FLU/RSV plus assay is intended as an aid in the diagnosis of influenza from Nasopharyngeal swab specimens and should not be used as a sole basis for treatment. Nasal washings and aspirates are unacceptable for Xpert Xpress SARS-CoV-2/FLU/RSV testing.  Fact Sheet for Patients: BloggerCourse.com  Fact Sheet for Healthcare Providers: SeriousBroker.it  This test is not yet approved or cleared by the [place] FDA and has been authorized for detection and/or diagnosis of SARS-CoV-2 by FDA under an Emergency  Use Authorization (EUA). This EUA will remain in effect (meaning this test can be used) for the duration of the COVID-19 declaration under Section 564(b)(1) of the Act, 21 U.S.C. section 360bbb-3(b)(1), unless the authorization is terminated or revoked.  Performed at [facility], [address] [place], [place] [address]   CBC with Differential     Status: Abnormal   Collection Time: 12/19/20  7:28 PM  Result Value Ref Range   WBC 14.1 (H) 4.0 - 10.5 K/uL   RBC 5.25 4.22 - 5.81 MIL/uL   Hemoglobin 16.1 13.0 - 17.0 g/dL   HCT 60.4 54.0 - 98.1 %   MCV 92.6 80.0 - 100.0 fL   MCH 30.7 26.0 - 34.0 pg   MCHC 33.1 30.0 - 36.0 g/dL   RDW 19.1 47.8 - 29.5 %   Platelets 406 (H) 150 -  400 K/uL   nRBC 0.0 0.0 - 0.2 %   Neutrophils Relative % 81 %   Neutro Abs 11.4 (H) 1.7 - 7.7 K/uL   Lymphocytes Relative 14 %   Lymphs Abs 2.0 0.7 - 4.0 K/uL   Monocytes Relative 4 %   Monocytes Absolute 0.5 0.1 - 1.0 K/uL   Eosinophils Relative 0 %   Eosinophils Absolute 0.1 0.0 - 0.5 K/uL   Basophils Relative 0 %   Basophils Absolute 0.0 0.0 - 0.1 K/uL   Immature Granulocytes 1 %   Abs Immature Granulocytes 0.08 (H) 0.00 - 0.07 K/uL    Comment: Performed at [facility], [address], [place], [place] [address]  Comprehensive metabolic panel     Status: Abnormal   Collection Time: 12/19/20  7:28 PM  Result Value Ref Range   Sodium 134 (L) 135 - 145 mmol/L   Potassium 3.4 (L) 3.5 - 5.1 mmol/L   Chloride 99 98 - 111 mmol/L   CO2 20 (L) 22 - 32 mmol/L   Glucose, Bld 219 (H) 70 - 99 mg/dL    Comment: Glucose reference range applies only to samples taken after fasting for at least 8 hours.   BUN 19 6 - 20 mg/dL   Creatinine, Ser 6.04 0.61 - 1.24 mg/dL   Calcium 54.0 8.9 - 98.1 mg/dL   Total Protein 8.9 (H) 6.5 - 8.1 g/dL   Albumin 4.9 3.5 - 5.0 g/dL   AST 22 15 - 41 U/L   ALT 26 0 - 44 U/L   Alkaline Phosphatase 105 38 - 126 U/L   Total Bilirubin 0.8 0.3 -  1.2 mg/dL   GFR, Estimated >19 >14 mL/min    Comment: (NOTE) Calculated using the CKD-EPI Creatinine Equation (2021)    Anion gap 15 5 - 15    Comment: Performed at [facility], [address], [place], [place] [address]  Lipase, blood     Status: None   Collection Time: 12/19/20  7:28 PM  Result Value Ref Range   Lipase 35 11 - 51 U/L    Comment: Performed at [facility], [address] [place], [place] [address]  Glucose, capillary     Status: Abnormal   Collection Time: 12/20/20 12:38 AM  Result Value Ref Range   Glucose-Capillary 162 (H) 70 - 99 mg/dL    Comment: Glucose reference range applies only to samples taken after fasting for at least 8 hours.  Magnesium     Status: None   Collection Time: 12/20/20  3:31 AM  Result Value Ref Range   Magnesium 1.8 1.7 - 2.4 mg/dL    Comment: Performed at [facility], [address] [place], [place] [address]  Basic metabolic panel     Status: Abnormal   Collection Time: 12/20/20  3:31 AM  Result Value Ref Range   Sodium 137 135 - 145 mmol/L   Potassium 5.0 3.5 - 5.1 mmol/L    Comment: DELTA CHECK NOTED NO VISIBLE HEMOLYSIS    Chloride 101 98 - 111 mmol/L   CO2 25 22 - 32 mmol/L   Glucose, Bld 165 (H) 70 - 99 mg/dL    Comment: Glucose reference range applies only to samples taken after fasting for at least 8 hours.   BUN 20 6 - 20 mg/dL   Creatinine, Ser 4.69 0.61 - 1.24 mg/dL   Calcium 9.0 8.9 - 62.9 mg/dL   GFR, Estimated >52 >84 mL/min  Comment: (NOTE) Calculated using the CKD-EPI Creatinine Equation (2021)    Anion gap 11 5 - 15    Comment: Performed at [facility], [address] [place], [place] [address]  CBC     Status: None   Collection Time: 12/20/20  3:31 AM  Result Value Ref Range   WBC 9.9 4.0 - 10.5 K/uL   RBC 4.71 4.22 - 5.81 MIL/uL   Hemoglobin 14.5 13.0 - 17.0 g/dL   HCT 34.7 42.5 - 95.6 %   MCV 93.6 80.0 - 100.0 fL   MCH  30.8 26.0 - 34.0 pg   MCHC 32.9 30.0 - 36.0 g/dL   RDW 38.7 56.4 - 33.2 %   Platelets 363 150 - 400 K/uL   nRBC 0.0 0.0 - 0.2 %    Comment: Performed at [facility], [address], [place], [place] [address]  Glucose, capillary     Status: Abnormal   Collection Time: 12/20/20  4:09 AM  Result Value Ref Range   Glucose-Capillary 157 (H) 70 - 99 mg/dL    Comment: Glucose reference range applies only to samples taken after fasting for at least 8 hours.  Glucose, capillary     Status: Abnormal   Collection Time: 12/20/20  8:02 AM  Result Value Ref Range   Glucose-Capillary 156 (H) 70 - 99 mg/dL    Comment: Glucose reference range applies only to samples taken after fasting for at least 8 hours.   CT ABDOMEN PELVIS W CONTRAST  Addendum Date: 12/19/2020   ADDENDUM REPORT: 12/19/2020 22:01 ADDENDUM: Critical findings were reported to PA Fayrene Helper at 10:01 p.m. Electronically Signed   By: Thornell Sartorius M.D.   On: 12/19/2020 22:01   Result Date: 12/19/2020 CLINICAL DATA:  Abdominal pain, nausea, vomiting. EXAM: CT ABDOMEN AND PELVIS WITH CONTRAST TECHNIQUE: Multidetector CT imaging of the abdomen and pelvis was performed using the standard protocol following bolus administration of intravenous contrast. CONTRAST:  OMNIPAQUE IOHEXOL 350 MG/ML SOLN COMPARISON:  04/13/2017. FINDINGS: Lower chest: The heart is normal in size and there is no pericardial effusion. A few scattered coronary artery calcifications are noted. Mild atelectasis or infiltrate is noted in the lower lobes bilaterally. Hepatobiliary: No focal liver abnormality is seen. No gallstones, gallbladder wall thickening, or biliary dilatation. Pancreas: Unremarkable. No pancreatic ductal dilatation or surrounding inflammatory changes. Spleen: Normal in size without focal abnormality. Adrenals/Urinary Tract: No adrenal nodule or mass. Evaluation for renal calculus is limited due to excreted contrast at the renal  pyramids and collecting systems. There is no hydronephrosis. No focal renal abnormality is identified. The urinary bladder is within normal limits. Stomach/Bowel: Scattered loops of mildly distended small bowel are noted in the abdomen measuring up to 3.4 cm in diameter and air-fluid levels are noted. There is a suspected transition point in the right lower quadrant, best seen on coronal image 96, however evaluation in this region is extremely limited due to streak artifact from a right hip prosthesis. The small bowel distal to this point is decompressed. Scattered diverticula are present along the colon without evidence of diverticulitis. A normal appendix is noted in the right lower quadrant Vascular/Lymphatic: Aortic atherosclerosis. No enlarged abdominal or pelvic lymph nodes. Reproductive: Prostate is unremarkable. Other: No free fluid. 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 Degenerative changes are present in the thoracic spine. No acute osseous abnormality is identified. IMPRESSION: Dilated loops of small bowel in the abdomen with scattered air-fluid levels,  suspicious for small bowel obstruction. There is a suspected transition point in the right lower quadrant, not well evaluated due to streak hardware artifact. The small bowel is collapsed distal to this point. Surgical consultation is recommended. Electronically Signed: By: Thornell Sartorius M.D. On: 12/19/2020 21:57      Assessment/Plan SBO - Patient is feeling better and has started to pass a little flatus. No role for acute surgical intervention. Will give him oral gastrograffin and obtain delayed abdominal film.   ID - none VTE - SCDs, sq heparin FEN - IVF, NPO Foley - none  HTN HLD DM Depression Obesity BMI 43.93 Recent right rotator cuff repair 07/2020 Dr. Cleotis Nipper, [facility] Surgery 12/20/2020, 8:56 AM Please see Amion for pager number during day hours 7:00am-4:30pm

## 2020-12-21 ENCOUNTER — Observation Stay (HOSPITAL_COMMUNITY): Payer: Federal, State, Local not specified - PPO

## 2020-12-21 DIAGNOSIS — Z79899 Other long term (current) drug therapy: Secondary | ICD-10-CM | POA: Diagnosis not present

## 2020-12-21 DIAGNOSIS — F32A Depression, unspecified: Secondary | ICD-10-CM | POA: Diagnosis present

## 2020-12-21 DIAGNOSIS — M1990 Unspecified osteoarthritis, unspecified site: Secondary | ICD-10-CM | POA: Diagnosis present

## 2020-12-21 DIAGNOSIS — J45909 Unspecified asthma, uncomplicated: Secondary | ICD-10-CM | POA: Diagnosis present

## 2020-12-21 DIAGNOSIS — E1159 Type 2 diabetes mellitus with other circulatory complications: Secondary | ICD-10-CM | POA: Diagnosis not present

## 2020-12-21 DIAGNOSIS — Z833 Family history of diabetes mellitus: Secondary | ICD-10-CM | POA: Diagnosis not present

## 2020-12-21 DIAGNOSIS — E1169 Type 2 diabetes mellitus with other specified complication: Secondary | ICD-10-CM | POA: Diagnosis present

## 2020-12-21 DIAGNOSIS — Z91048 Other nonmedicinal substance allergy status: Secondary | ICD-10-CM | POA: Diagnosis not present

## 2020-12-21 DIAGNOSIS — E119 Type 2 diabetes mellitus without complications: Secondary | ICD-10-CM | POA: Diagnosis not present

## 2020-12-21 DIAGNOSIS — Z20822 Contact with and (suspected) exposure to covid-19: Secondary | ICD-10-CM | POA: Diagnosis present

## 2020-12-21 DIAGNOSIS — E1165 Type 2 diabetes mellitus with hyperglycemia: Secondary | ICD-10-CM | POA: Diagnosis present

## 2020-12-21 DIAGNOSIS — E871 Hypo-osmolality and hyponatremia: Secondary | ICD-10-CM | POA: Diagnosis present

## 2020-12-21 DIAGNOSIS — Z6841 Body Mass Index (BMI) 40.0 and over, adult: Secondary | ICD-10-CM | POA: Diagnosis not present

## 2020-12-21 DIAGNOSIS — Z7982 Long term (current) use of aspirin: Secondary | ICD-10-CM | POA: Diagnosis not present

## 2020-12-21 DIAGNOSIS — I152 Hypertension secondary to endocrine disorders: Secondary | ICD-10-CM | POA: Diagnosis present

## 2020-12-21 DIAGNOSIS — Z794 Long term (current) use of insulin: Secondary | ICD-10-CM | POA: Diagnosis not present

## 2020-12-21 DIAGNOSIS — E669 Obesity, unspecified: Secondary | ICD-10-CM | POA: Diagnosis present

## 2020-12-21 DIAGNOSIS — Z888 Allergy status to other drugs, medicaments and biological substances status: Secondary | ICD-10-CM | POA: Diagnosis not present

## 2020-12-21 DIAGNOSIS — E876 Hypokalemia: Secondary | ICD-10-CM | POA: Diagnosis present

## 2020-12-21 DIAGNOSIS — K56609 Unspecified intestinal obstruction, unspecified as to partial versus complete obstruction: Secondary | ICD-10-CM | POA: Diagnosis present

## 2020-12-21 DIAGNOSIS — E785 Hyperlipidemia, unspecified: Secondary | ICD-10-CM | POA: Diagnosis present

## 2020-12-21 LAB — GLUCOSE, CAPILLARY
Glucose-Capillary: 103 mg/dL — ABNORMAL HIGH (ref 70–99)
Glucose-Capillary: 109 mg/dL — ABNORMAL HIGH (ref 70–99)
Glucose-Capillary: 118 mg/dL — ABNORMAL HIGH (ref 70–99)
Glucose-Capillary: 127 mg/dL — ABNORMAL HIGH (ref 70–99)
Glucose-Capillary: 130 mg/dL — ABNORMAL HIGH (ref 70–99)
Glucose-Capillary: 134 mg/dL — ABNORMAL HIGH (ref 70–99)

## 2020-12-21 MED ORDER — SODIUM CHLORIDE 0.9 % IV SOLN
12.5000 mg | Freq: Four times a day (QID) | INTRAVENOUS | Status: DC | PRN
  Filled 2020-12-21 (×2): qty 0.5

## 2020-12-21 MED ORDER — HYDRALAZINE HCL 20 MG/ML IJ SOLN
10.0000 mg | Freq: Three times a day (TID) | INTRAMUSCULAR | Status: DC
  Administered 2020-12-21 – 2020-12-23 (×5): 10 mg via INTRAVENOUS
  Filled 2020-12-21 (×5): qty 1

## 2020-12-21 NOTE — Plan of Care (Signed)
  Problem: Coping: Goal: Level of anxiety will decrease Outcome: Progressing   Problem: Pain Managment: Goal: General experience of comfort will improve Outcome: Progressing   

## 2020-12-21 NOTE — Progress Notes (Signed)
Patient ID: Kenneth Barron, male   DOB: 02-24-1965, 55 y.o.   MRN: [id number] [facility] Surgery Progress Note     Subjective: CC-  Up in chair. Feeling worse. States that he is having more crampy abdominal pain and nausea. He dry heaved and vomited once over night. He had two very small bowel movements but is no longer passing flatus.  Xray yesterday showed no contrast in colon and persistently dilated loops of small bowel.  Objective: Vital signs in last 24 hours: Temp:  [97.7 F (36.5 C)-99.2 F (37.3 C)] 98 F (36.7 C) (11/15 0436) Pulse Rate:  [54-89] 89 (11/15 0436) Resp:  [16-18] 17 (11/15 0436) BP: (160-199)/(87-122) 160/88 (11/15 0436) SpO2:  [93 %-99 %] 93 % (11/15 0436) Last BM Date: 12/20/20  Intake/Output from previous day: 11/14 0701 - 11/15 0700 In: 500 [I.V.:500] Out: -  Intake/Output this shift: No intake/output data recorded.  PE: Gen:  Alert, NAD, pleasant Pulm: rate and effort normal Abd: soft, mild distension, mild right sided TTP without rebound or guarding, hypoactive bowel sounds, no masses, hernias, or organomegaly Psych: A&Ox3 Skin: no rashes noted, warm and dry  Lab Results:  Recent Labs    12/19/20 1928 12/20/20 0331  WBC 14.1* 9.9  HGB 16.1 14.5  HCT 48.6 44.1  PLT 406* 363   BMET Recent Labs    12/19/20 1928 12/20/20 0331  NA 134* 137  K 3.4* 5.0  CL 99 101  CO2 20* 25  GLUCOSE 219* 165*  BUN 19 20  CREATININE 0.91 0.91  CALCIUM 10.3 9.0   PT/INR No results for input(s): LABPROT, INR in the last 72 hours. CMP     Component Value Date/Time   NA 137 12/20/2020 0331   K 5.0 12/20/2020 0331   CL 101 12/20/2020 0331   CO2 25 12/20/2020 0331   GLUCOSE 165 (H) 12/20/2020 0331   BUN 20 12/20/2020 0331   CREATININE 0.91 12/20/2020 0331   CALCIUM 9.0 12/20/2020 0331   PROT 8.9 (H) 12/19/2020 1928   ALBUMIN 4.9 12/19/2020 1928   AST 22 12/19/2020 1928   ALT 26 12/19/2020 1928   ALKPHOS 105 12/19/2020 1928    BILITOT 0.8 12/19/2020 1928   GFRNONAA >60 12/20/2020 0331   GFRAA >60 04/13/2017 1309   Lipase     Component Value Date/Time   LIPASE 35 12/19/2020 1928       Studies/Results: CT ABDOMEN PELVIS W CONTRAST  Addendum Date: 12/19/2020   ADDENDUM REPORT: 12/19/2020 22:01 ADDENDUM: Critical findings were reported to PA Fayrene Helper at 10:01 p.m. Electronically Signed   By: Thornell Sartorius M.D.   On: 12/19/2020 22:01   Result Date: 12/19/2020 CLINICAL DATA:  Abdominal pain, nausea, vomiting. EXAM: CT ABDOMEN AND PELVIS WITH CONTRAST TECHNIQUE: Multidetector CT imaging of the abdomen and pelvis was performed using the standard protocol following bolus administration of intravenous contrast. CONTRAST:  OMNIPAQUE IOHEXOL 350 MG/ML SOLN COMPARISON:  04/13/2017. FINDINGS: Lower chest: The heart is normal in size and there is no pericardial effusion. A few scattered coronary artery calcifications are noted. Mild atelectasis or infiltrate is noted in the lower lobes bilaterally. Hepatobiliary: No focal liver abnormality is seen. No gallstones, gallbladder wall thickening, or biliary dilatation. Pancreas: Unremarkable. No pancreatic ductal dilatation or surrounding inflammatory changes. Spleen: Normal in size without focal abnormality. Adrenals/Urinary Tract: No adrenal nodule or mass. Evaluation for renal calculus is limited due to excreted contrast at the renal pyramids and collecting systems. There is no hydronephrosis.  No focal renal abnormality is identified. The urinary bladder is within normal limits. Stomach/Bowel: Scattered loops of mildly distended small bowel are noted in the abdomen measuring up to 3.4 cm in diameter and air-fluid levels are noted. There is a suspected transition point in the right lower quadrant, best seen on coronal image 96, however evaluation in this region is extremely limited due to streak artifact from a right hip prosthesis. The small bowel distal to this point is  decompressed. Scattered diverticula are present along the colon without evidence of diverticulitis. A normal appendix is noted in the right lower quadrant Vascular/Lymphatic: Aortic atherosclerosis. No enlarged abdominal or pelvic lymph nodes. Reproductive: Prostate is unremarkable. Other: No free fluid. 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 Degenerative changes are present in the thoracic spine. No acute osseous abnormality is identified. IMPRESSION: Dilated loops of small bowel in the abdomen with scattered air-fluid levels, suspicious for small bowel obstruction. There is a suspected transition point in the right lower quadrant, not well evaluated due to streak hardware artifact. The small bowel is collapsed distal to this point. Surgical consultation is recommended. Electronically Signed: By: Thornell Sartorius M.D. On: 12/19/2020 21:57   DG Abd Portable 1V-Small Bowel Obstruction Protocol-initial, 8 hr delay  Result Date: 12/20/2020 CLINICAL DATA:  Small bowel obstruction.  8 hour delayed image. EXAM: PORTABLE ABDOMEN - 1 VIEW COMPARISON:  CT abdomen pelvis dated 12/21/2020. FINDINGS: Persistent dilatation of small-bowel loops measuring up to 5 cm. No significant contrast noted within the colon. IMPRESSION: Persistent dilatation of small-bowel loops. Electronically Signed   By: Elgie Collard M.D.   On: 12/20/2020 17:59    Anti-infectives: Anti-infectives (From admission, onward)    None        Assessment/Plan SBO - Keep NPO. Will obtain repeat film today. Mobilize. If he has persistent n/v will need to place NG tube.   ID - none VTE - SCDs, sq heparin FEN - IVF, NPO Foley - none   HTN HLD DM Depression Obesity BMI 43.93 Recent right rotator cuff repair 07/2020 Dr. Shelle Iron   LOS: 0 days    Franne Forts, [facility] Surgery 12/21/2020, 9:43 AM Please see Amion for pager number during day hours 7:00am-4:30pm

## 2020-12-21 NOTE — Progress Notes (Signed)
PROGRESS NOTE    Joab Carden  JME:[id number] DOB: December 05, 1965 DOA: 12/19/2020 PCP: Malka So., MD   Brief Narrative: Tinsley Lomas is a 55 y.o. male with a history of diabetes mellitus type 2, hypertension, hyperlipidemia, asthma, depression. Patient presented secondary to nausea/vomiting/abdominal pain with CT evidence of small bowel obstruction. NPO and general surgery consulted. NG tube deferred for now but patient with worsened nausea with vomiting. No flatus or bowel movement.   Assessment & Plan:   * Small bowel obstruction (HCC) CT abdomen/pelvis significant for dilated loops of small bowel concerning for SBO with possible transition point in RLQ. NPO on midnight. NG tube deferred. General surgery consulted on admission. Abdominal x-ray (11/15) with persistent obstructive pattern -General surgery recommendations: ambulation, possible need for NG tube  Depression Patient is on Zoloft as an outpatient. Held secondary to NPO status.  Hyperlipidemia associated with type 2 diabetes mellitus (HCC) On Lipitor as an outpatient which is held secondary to NPO status.  Hypertension associated with diabetes (HCC) Patient is on amlodipine, metformin, lisinopril, hydrochlorothiazide as an outpatient which have been held on admission secondary to NPO status. Currently uncontrolled. -Start hydralazine IV q8 hours  Insulin dependent type 2 diabetes mellitus (HCC) Patient is on [place] and Ozempic as an outpatient which is held on admission. Good control with SSI only at this time. -Continue SSI -Since type 2 and controlled well enough, will hold long acting while NPO  Hypokalemia-resolved as of 12/20/2020 Given supplementation. Resolved.    DVT prophylaxis: Heparin subq Code Status:   Code Status: Full Code Family Communication: None at bedside Disposition Plan: Discharge home likely in 3-5 days   Consultants:  General surgery  Procedures:   None  Antimicrobials: None    Subjective: Nausea with episode of emesis this morning. Persistent bloating. Not passing gas.  Objective: Vitals:   12/20/20 2022 12/21/20 0358 12/21/20 0436 12/21/20 1252  BP: (!) 198/114 (!) 199/122 (!) 160/88 (!) 191/119  Pulse: 76  89 89  Resp: 17  17 16   Temp: 98.2 F (36.8 C)  98 F (36.7 C) 97.8 F (36.6 C)  TempSrc: Oral   Oral  SpO2: 95%  93% 94%  Weight:      Height:        Intake/Output Summary (Last 24 hours) at 12/21/2020 1259 Last data filed at 12/21/2020 0200 Gross per 24 hour  Intake 500 ml  Output --  Net 500 ml    Filed Weights   12/20/20 0408  Weight: (!) 142.9 kg    Examination:  General exam: Appears calm and comfortable Respiratory system: Clear to auscultation. Respiratory effort normal. Cardiovascular system: S1 & S2 heard, RRR. No murmurs, rubs, gallops or clicks. Gastrointestinal system: Abdomen is distended, soft and mildly tender in RLQ. No organomegaly or masses felt. Very infrequent bowel sounds heard. Central nervous system: Alert and oriented. No focal neurological deficits. Musculoskeletal: No edema. No calf tenderness Skin: No cyanosis. No rashes Psychiatry: Judgement and insight appear normal. Mood & affect appropriate.     Data Reviewed: I have personally reviewed following labs and imaging studies  CBC Lab Results  Component Value Date   WBC 9.9 12/20/2020   RBC 4.71 12/20/2020   HGB 14.5 12/20/2020   HCT 44.1 12/20/2020   MCV 93.6 12/20/2020   MCH 30.8 12/20/2020   PLT 363 12/20/2020   MCHC 32.9 12/20/2020   RDW 13.6 12/20/2020   LYMPHSABS 2.0 12/19/2020   MONOABS 0.5 12/19/2020   EOSABS 0.1 12/19/2020  BASOSABS 0.0 12/19/2020     Last metabolic panel Lab Results  Component Value Date   NA 137 12/20/2020   K 5.0 12/20/2020   CL 101 12/20/2020   CO2 25 12/20/2020   BUN 20 12/20/2020   CREATININE 0.91 12/20/2020   GLUCOSE 165 (H) 12/20/2020   GFRNONAA >60 12/20/2020    GFRAA >60 04/13/2017   CALCIUM 9.0 12/20/2020   PROT 8.9 (H) 12/19/2020   ALBUMIN 4.9 12/19/2020   BILITOT 0.8 12/19/2020   ALKPHOS 105 12/19/2020   AST 22 12/19/2020   ALT 26 12/19/2020   ANIONGAP 11 12/20/2020    CBG (last 3)  Recent Labs    12/21/20 0401 12/21/20 0807 12/21/20 1146  GLUCAP 109* 130* 118*      GFR: Estimated Creatinine Clearance: 132.7 mL/min (by C-G formula based on SCr of 0.91 mg/dL).  Coagulation Profile: No results for input(s): INR, PROTIME in the last 168 hours.  Recent Results (from the past 240 hour(s))  Resp Panel by RT-PCR (Flu A&B, Covid) Nasopharyngeal Swab     Status: None   Collection Time: 12/19/20  7:05 PM   Specimen: Nasopharyngeal Swab; Nasopharyngeal(NP) swabs in vial transport medium  Result Value Ref Range Status   SARS Coronavirus 2 by RT PCR NEGATIVE NEGATIVE Final    Comment: (NOTE) SARS-CoV-2 target nucleic acids are NOT DETECTED.  The SARS-CoV-2 RNA is generally detectable in upper respiratory specimens during the acute phase of infection. The lowest concentration of SARS-CoV-2 viral copies this assay can detect is 138 copies/mL. A negative result does not preclude SARS-Cov-2 infection and should not be used as the sole basis for treatment or other patient management decisions. A negative result may occur with  improper specimen collection/handling, submission of specimen other than nasopharyngeal swab, presence of viral mutation(s) within the areas targeted by this assay, and inadequate number of viral copies(<138 copies/mL). A negative result must be combined with clinical observations, patient history, and epidemiological information. The expected result is Negative.  Fact Sheet for Patients:  BloggerCourse.com  Fact Sheet for Healthcare Providers:  SeriousBroker.it  This test is no t yet approved or cleared by the [place] FDA and  has been authorized for  detection and/or diagnosis of SARS-CoV-2 by FDA under an Emergency Use Authorization (EUA). This EUA will remain  in effect (meaning this test can be used) for the duration of the COVID-19 declaration under Section 564(b)(1) of the Act, 21 U.S.C.section 360bbb-3(b)(1), unless the authorization is terminated  or revoked sooner.       Influenza A by PCR NEGATIVE NEGATIVE Final   Influenza B by PCR NEGATIVE NEGATIVE Final    Comment: (NOTE) The Xpert Xpress SARS-CoV-2/FLU/RSV plus assay is intended as an aid in the diagnosis of influenza from Nasopharyngeal swab specimens and should not be used as a sole basis for treatment. Nasal washings and aspirates are unacceptable for Xpert Xpress SARS-CoV-2/FLU/RSV testing.  Fact Sheet for Patients: BloggerCourse.com  Fact Sheet for Healthcare Providers: SeriousBroker.it  This test is not yet approved or cleared by the [place] FDA and has been authorized for detection and/or diagnosis of SARS-CoV-2 by FDA under an Emergency Use Authorization (EUA). This EUA will remain in effect (meaning this test can be used) for the duration of the COVID-19 declaration under Section 564(b)(1) of the Act, 21 U.S.C. section 360bbb-3(b)(1), unless the authorization is terminated or revoked.  Performed at [facility], [address], [place], [place] [address]  Radiology Studies: CT ABDOMEN PELVIS W CONTRAST  Addendum Date: 12/19/2020   ADDENDUM REPORT: 12/19/2020 22:01 ADDENDUM: Critical findings were reported to PA Fayrene Helper at 10:01 p.m. Electronically Signed   By: Thornell Sartorius M.D.   On: 12/19/2020 22:01   Result Date: 12/19/2020 CLINICAL DATA:  Abdominal pain, nausea, vomiting. EXAM: CT ABDOMEN AND PELVIS WITH CONTRAST TECHNIQUE: Multidetector CT imaging of the abdomen and pelvis was performed using the standard protocol following bolus administration of  intravenous contrast. CONTRAST:  OMNIPAQUE IOHEXOL 350 MG/ML SOLN COMPARISON:  04/13/2017. FINDINGS: Lower chest: The heart is normal in size and there is no pericardial effusion. A few scattered coronary artery calcifications are noted. Mild atelectasis or infiltrate is noted in the lower lobes bilaterally. Hepatobiliary: No focal liver abnormality is seen. No gallstones, gallbladder wall thickening, or biliary dilatation. Pancreas: Unremarkable. No pancreatic ductal dilatation or surrounding inflammatory changes. Spleen: Normal in size without focal abnormality. Adrenals/Urinary Tract: No adrenal nodule or mass. Evaluation for renal calculus is limited due to excreted contrast at the renal pyramids and collecting systems. There is no hydronephrosis. No focal renal abnormality is identified. The urinary bladder is within normal limits. Stomach/Bowel: Scattered loops of mildly distended small bowel are noted in the abdomen measuring up to 3.4 cm in diameter and air-fluid levels are noted. There is a suspected transition point in the right lower quadrant, best seen on coronal image 96, however evaluation in this region is extremely limited due to streak artifact from a right hip prosthesis. The small bowel distal to this point is decompressed. Scattered diverticula are present along the colon without evidence of diverticulitis. A normal appendix is noted in the right lower quadrant Vascular/Lymphatic: Aortic atherosclerosis. No enlarged abdominal or pelvic lymph nodes. Reproductive: Prostate is unremarkable. Other: No free fluid. 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 Degenerative changes are present in the thoracic spine. No acute osseous abnormality is identified. IMPRESSION: Dilated loops of small bowel in the abdomen with scattered air-fluid levels, suspicious for small bowel obstruction. There is a suspected transition point in the right lower quadrant, not well evaluated  due to streak hardware artifact. The small bowel is collapsed distal to this point. Surgical consultation is recommended. Electronically Signed: By: Thornell Sartorius M.D. On: 12/19/2020 21:57   DG Abd Portable 1V  Result Date: 12/21/2020 CLINICAL DATA:  Nausea and vomiting. EXAM: PORTABLE ABDOMEN - 1 VIEW COMPARISON:  12/20/2020 FINDINGS: Low lung volumes with vascular crowding and bibasilar atelectasis. Persistent dilated small bowel loops consistent with a small-bowel obstruction. No free air. IMPRESSION: Persistent small-bowel obstruction bowel gas pattern. Electronically Signed   By: Rudie Meyer M.D.   On: 12/21/2020 11:29   DG Abd Portable 1V-Small Bowel Obstruction Protocol-initial, 8 hr delay  Result Date: 12/20/2020 CLINICAL DATA:  Small bowel obstruction.  8 hour delayed image. EXAM: PORTABLE ABDOMEN - 1 VIEW COMPARISON:  CT abdomen pelvis dated 12/21/2020. FINDINGS: Persistent dilatation of small-bowel loops measuring up to 5 cm. No significant contrast noted within the colon. IMPRESSION: Persistent dilatation of small-bowel loops. Electronically Signed   By: Elgie Collard M.D.   On: 12/20/2020 17:59        Scheduled Meds:  heparin  5,000 Units Subcutaneous Q8H   hydrALAZINE  10 mg Intravenous Q8H   insulin aspart  0-15 Units Subcutaneous Q4H   sodium chloride flush  3 mL Intravenous Q12H   Continuous Infusions:  sodium chloride 75 mL/hr at 12/21/20 0804   promethazine (PHENERGAN)  injection (IM or IVPB)       LOS: 0 days     Jacquelin Hawking, MD [facility] 12/21/2020, 12:59 PM  If 7PM-7AM, please contact night-coverage [email or web address]

## 2020-12-22 ENCOUNTER — Inpatient Hospital Stay (HOSPITAL_COMMUNITY): Payer: Federal, State, Local not specified - PPO

## 2020-12-22 DIAGNOSIS — K56609 Unspecified intestinal obstruction, unspecified as to partial versus complete obstruction: Secondary | ICD-10-CM | POA: Diagnosis not present

## 2020-12-22 LAB — BASIC METABOLIC PANEL
Anion gap: 9 (ref 5–15)
BUN: 17 mg/dL (ref 6–20)
CO2: 23 mmol/L (ref 22–32)
Calcium: 8.6 mg/dL — ABNORMAL LOW (ref 8.9–10.3)
Chloride: 107 mmol/L (ref 98–111)
Creatinine, Ser: 0.99 mg/dL (ref 0.61–1.24)
GFR, Estimated: >60 mL/min (ref >60)
Glucose, Bld: 102 mg/dL — ABNORMAL HIGH (ref 70–99)
Potassium: 3.5 mmol/L (ref 3.5–5.1)
Sodium: 139 mmol/L (ref 135–145)

## 2020-12-22 LAB — GLUCOSE, CAPILLARY
Glucose-Capillary: 100 mg/dL — ABNORMAL HIGH (ref 70–99)
Glucose-Capillary: 120 mg/dL — ABNORMAL HIGH (ref 70–99)
Glucose-Capillary: 127 mg/dL — ABNORMAL HIGH (ref 70–99)
Glucose-Capillary: 98 mg/dL (ref 70–99)

## 2020-12-22 LAB — CBC
HCT: 38.5 % — ABNORMAL LOW (ref 39.0–52.0)
Hemoglobin: 12.5 g/dL — ABNORMAL LOW (ref 13.0–17.0)
MCH: 31.2 pg (ref 26.0–34.0)
MCHC: 32.5 g/dL (ref 30.0–36.0)
MCV: 96 fL (ref 80.0–100.0)
Platelets: 292 10*3/uL (ref 150–400)
RBC: 4.01 MIL/uL — ABNORMAL LOW (ref 4.22–5.81)
RDW: 14.1 % (ref 11.5–15.5)
WBC: 7.2 10*3/uL (ref 4.0–10.5)
nRBC: 0 % (ref 0.0–0.2)

## 2020-12-22 MED ORDER — DEXTROSE-NACL 5-0.45 % IV SOLN: INTRAVENOUS | Status: DC

## 2020-12-22 MED ORDER — POTASSIUM CHLORIDE 10 MEQ/100ML IV SOLN
10.0000 meq | Freq: Once | INTRAVENOUS | Status: AC
  Administered 2020-12-22: 10 meq via INTRAVENOUS
  Filled 2020-12-22: qty 100

## 2020-12-22 MED ORDER — HYDRALAZINE HCL 20 MG/ML IJ SOLN: 10.0000 mg | INTRAMUSCULAR | Status: DC | PRN

## 2020-12-22 MED ORDER — POLYETHYLENE GLYCOL 3350 17 G PO PACK
17.0000 g | PACK | Freq: Two times a day (BID) | ORAL | Status: DC
  Administered 2020-12-22 (×2): 17 g via ORAL
  Filled 2020-12-22 (×3): qty 1

## 2020-12-22 MED ORDER — SENNOSIDES-DOCUSATE SODIUM 8.6-50 MG PO TABS: 1.0000 | ORAL_TABLET | Freq: Every evening | ORAL | Status: DC | PRN

## 2020-12-22 MED ORDER — METOPROLOL TARTRATE 5 MG/5ML IV SOLN: 5.0000 mg | INTRAVENOUS | Status: DC | PRN

## 2020-12-22 MED ORDER — POTASSIUM CHLORIDE 10 MEQ/100ML IV SOLN
10.0000 meq | INTRAVENOUS | Status: AC
  Administered 2020-12-22 (×3): 10 meq via INTRAVENOUS
  Filled 2020-12-22 (×3): qty 100

## 2020-12-22 MED ORDER — INFLUENZA VAC SPLIT QUAD 0.5 ML IM SUSY
0.5000 mL | PREFILLED_SYRINGE | INTRAMUSCULAR | Status: DC
  Filled 2020-12-22: qty 0.5

## 2020-12-22 NOTE — Progress Notes (Signed)
Patient ID: Kenneth Barron, male   DOB: [birth year], 55 y.o.   MRN: [id number] [facility] Surgery Progress Note     Subjective: CC-  Feeling much better this morning. States that last night his bowel function returned and he had at least 7 bowel movements. Nausea/vomiting resolved. Abdomen a little sore but pain improved.  Objective: Vital signs in last 24 hours: Temp:  [97.8 F (36.6 C)-99.5 F (37.5 C)] 98.5 F (36.9 C) (11/16 0429) Pulse Rate:  [84-100] 84 (11/16 0429) Resp:  [16-18] 17 (11/16 0429) BP: (152-191)/(91-120) 161/91 (11/16 0429) SpO2:  [94 %-97 %] 97 % (11/16 0429) Last BM Date: 12/21/20  Intake/Output from previous day: 11/15 0701 - 11/16 0700 In: 1527.5 [I.V.:1527.5] Out: -  Intake/Output this shift: No intake/output data recorded.  PE: Gen:  Alert, NAD, pleasant Pulm: rate and effort normal Abd: soft, nondistended, nontender, no masses, hernias, or organomegaly Psych: A&Ox3 Skin: no rashes noted, warm and dry  Lab Results:  Recent Labs    12/20/20 0331 12/22/20 0335  WBC 9.9 7.2  HGB 14.5 12.5*  HCT 44.1 38.5*  PLT 363 292   BMET Recent Labs    12/20/20 0331 12/22/20 0335  NA 137 139  K 5.0 3.5  CL 101 107  CO2 25 23  GLUCOSE 165* 102*  BUN 20 17  CREATININE 0.91 0.99  CALCIUM 9.0 8.6*   PT/INR No results for input(s): LABPROT, INR in the last 72 hours. CMP     Component Value Date/Time   NA 139 12/22/2020 0335   K 3.5 12/22/2020 0335   CL 107 12/22/2020 0335   CO2 23 12/22/2020 0335   GLUCOSE 102 (H) 12/22/2020 0335   BUN 17 12/22/2020 0335   CREATININE 0.99 12/22/2020 0335   CALCIUM 8.6 (L) 12/22/2020 0335   PROT 8.9 (H) 12/19/2020 1928   ALBUMIN 4.9 12/19/2020 1928   AST 22 12/19/2020 1928   ALT 26 12/19/2020 1928   ALKPHOS 105 12/19/2020 1928   BILITOT 0.8 12/19/2020 1928   GFRNONAA >60 12/22/2020 0335   GFRAA >60 04/13/2017 1309   Lipase     Component Value Date/Time   LIPASE 35 12/19/2020 1928        Studies/Results: DG Abd Portable 1V  Result Date: 12/22/2020 CLINICAL DATA:  Small-bowel obstruction. EXAM: PORTABLE ABDOMEN - 1 VIEW COMPARISON:  Prior CT scan radiographs. FINDINGS: Contrast has now moved into the colon. The small bowel loops are less dilated. Findings consistent with a resolving small bowel obstruction. IMPRESSION: Resolving small bowel obstruction. Electronically Signed   By: Rudie Meyer M.D.   On: 12/22/2020 08:02   DG Abd Portable 1V  Result Date: 12/21/2020 CLINICAL DATA:  Nausea and vomiting. EXAM: PORTABLE ABDOMEN - 1 VIEW COMPARISON:  12/20/2020 FINDINGS: Low lung volumes with vascular crowding and bibasilar atelectasis. Persistent dilated small bowel loops consistent with a small-bowel obstruction. No free air. IMPRESSION: Persistent small-bowel obstruction bowel gas pattern. Electronically Signed   By: Rudie Meyer M.D.   On: 12/21/2020 11:29   DG Abd Portable 1V-Small Bowel Obstruction Protocol-initial, 8 hr delay  Result Date: 12/20/2020 CLINICAL DATA:  Small bowel obstruction.  8 hour delayed image. EXAM: PORTABLE ABDOMEN - 1 VIEW COMPARISON:  CT abdomen pelvis dated 12/21/2020. FINDINGS: Persistent dilatation of small-bowel loops measuring up to 5 cm. No significant contrast noted within the colon. IMPRESSION: Persistent dilatation of small-bowel loops. Electronically Signed   By: Elgie Collard M.D.   On: 12/20/2020 17:59    Anti-infectives:  Anti-infectives (From admission, onward)    None        Assessment/Plan SBO - Xray shows contrast in colon and resolving SBO - Patient feeling much better and having bowel function. Will start on clear liquids and advance to fulls later today if tolerating. Add miralax. Mobilize. Possibly solid food and home tomorrow if he continues to do well.   ID - none VTE - SCDs, sq heparin FEN - d/c IVF when tolerating PO, CLD>>FLD Foley - none   HTN HLD DM Depression Obesity BMI 43.93 Recent right  rotator cuff repair 07/2020 Dr. Shelle Iron   LOS: 1 day    Franne Forts, [facility] Surgery 12/22/2020, 8:53 AM Please see Amion for pager number during day hours 7:00am-4:30pm

## 2020-12-22 NOTE — Progress Notes (Signed)
PROGRESS NOTE    Kenneth Barron  RWE:[id number] DOB: 1965-06-15 DOA: 12/19/2020 PCP: Malka So., MD   Brief Narrative:  25 with history of DM2, HTN, HLD, asthma, depression admitted to the hospital with complains of nausea vomiting abdominal pain.  Patient was diagnosed with small bowel obstruction.  He was made n.p.o. and general surgery was consulted.  Initially NG tube was deferred as patient denied any vomiting.   Assessment & Plan:   Principal Problem:   Small bowel obstruction (HCC) Active Problems:   Insulin dependent type 2 diabetes mellitus (HCC)   Hypertension associated with diabetes (HCC)   Hyperlipidemia associated with type 2 diabetes mellitus (HCC)   Depression   Small bowel obstruction, improving - This is diagnosed on CT abdomen pelvis.  General surgery team is following.  Ambulate as much as possible, Accu-Cheks.  Plans to advance diet as tolerated. -Monitor and replete electrolytes as possible  Hyperlipidemia - Home statin on hold  Diabetes mellitus type 2, insulin-dependent - Home regimen is currently on hold.  Continue Accu-Cheks  Essential hypertension - IV hydralazine as needed  History of depression - On Zoloft at home which is currently on hold    DVT prophylaxis: Subcu heparin Code Status: Full code Family Communication:    Status is: Inpatient  Remains inpatient appropriate because: Unable to tolerate orals, general surgery is following.     Subjective: He was able to have a bowel movement overnight and passing some gas.  This morning tolerating some clear liquid diet.  Review of Systems Otherwise negative except as per HPI, including: General: Denies fever, chills, night sweats or unintended weight loss. Resp: Denies cough, wheezing, shortness of breath. Cardiac: Denies chest pain, palpitations, orthopnea, paroxysmal nocturnal dyspnea. GI: Denies abdominal pain, nausea, vomiting, diarrhea or constipation GU: Denies  dysuria, frequency, hesitancy or incontinence MS: Denies muscle aches, joint pain or swelling Neuro: Denies headache, neurologic deficits (focal weakness, numbness, tingling), abnormal gait Psych: Denies anxiety, depression, SI/HI/AVH Skin: Denies new rashes or lesions ID: Denies sick contacts, exotic exposures, travel  Examination:  General exam: Appears calm and comfortable  Respiratory system: Clear to auscultation. Respiratory effort normal. Cardiovascular system: S1 & S2 heard, RRR. No JVD, murmurs, rubs, gallops or clicks. No pedal edema. Gastrointestinal system: Abdomen is nondistended, soft and nontender. No organomegaly or masses felt. Normal bowel sounds heard. Central nervous system: Alert and oriented. No focal neurological deficits. Extremities: Symmetric 5 x 5 power. Skin: No rashes, lesions or ulcers Psychiatry: Judgement and insight appear normal. Mood & affect appropriate.     Objective: Vitals:   12/21/20 1858 12/21/20 2038 12/21/20 2314 12/22/20 0429  BP: (!) 191/120 (!) 180/114 (!) 152/95 (!) 161/91  Pulse:  100 91 84  Resp:  17 16 17   Temp:  98.4 F (36.9 C) 99.5 F (37.5 C) 98.5 F (36.9 C)  TempSrc:  Oral Oral Oral  SpO2:  95% 97% 97%  Weight:      Height:        Intake/Output Summary (Last 24 hours) at 12/22/2020 0723 Last data filed at 12/21/2020 2222 Gross per 24 hour  Intake 1527.5 ml  Output --  Net 1527.5 ml   Filed Weights   12/20/20 0408  Weight: (!) 142.9 kg     Data Reviewed:   CBC: Recent Labs  Lab 12/19/20 1928 12/20/20 0331 12/22/20 0335  WBC 14.1* 9.9 7.2  NEUTROABS 11.4*  --   --   HGB 16.1 14.5 12.5*  HCT 48.6 44.1  38.5*  MCV 92.6 93.6 96.0  PLT 406* 363 292   Basic Metabolic Panel: Recent Labs  Lab 12/19/20 1928 12/20/20 0331 12/22/20 0335  NA 134* 137 139  K 3.4* 5.0 3.5  CL 99 101 107  CO2 20* 25 23  GLUCOSE 219* 165* 102*  BUN 19 20 17   CREATININE 0.91 0.91 0.99  CALCIUM 10.3 9.0 8.6*  MG  --  1.8   --    GFR: Estimated Creatinine Clearance: 122 mL/min (by C-G formula based on SCr of 0.99 mg/dL). Liver Function Tests: Recent Labs  Lab 12/19/20 1928  AST 22  ALT 26  ALKPHOS 105  BILITOT 0.8  PROT 8.9*  ALBUMIN 4.9   Recent Labs  Lab 12/19/20 1928  LIPASE 35   No results for input(s): AMMONIA in the last 168 hours. Coagulation Profile: No results for input(s): INR, PROTIME in the last 168 hours. Cardiac Enzymes: No results for input(s): CKTOTAL, CKMB, CKMBINDEX, TROPONINI in the last 168 hours. BNP (last 3 results) No results for input(s): PROBNP in the last 8760 hours. HbA1C: No results for input(s): HGBA1C in the last 72 hours. CBG: Recent Labs  Lab 12/21/20 1146 12/21/20 1631 12/21/20 2036 12/22/20 0017 12/22/20 0433  GLUCAP 118* 103* 127* 100* 98   Lipid Profile: No results for input(s): CHOL, HDL, LDLCALC, TRIG, CHOLHDL, LDLDIRECT in the last 72 hours. Thyroid Function Tests: No results for input(s): TSH, T4TOTAL, FREET4, T3FREE, THYROIDAB in the last 72 hours. Anemia Panel: No results for input(s): VITAMINB12, FOLATE, FERRITIN, TIBC, IRON, RETICCTPCT in the last 72 hours. Sepsis Labs: No results for input(s): PROCALCITON, LATICACIDVEN in the last 168 hours.  Recent Results (from the past 240 hour(s))  Resp Panel by RT-PCR (Flu A&B, Covid) Nasopharyngeal Swab     Status: None   Collection Time: 12/19/20  7:05 PM   Specimen: Nasopharyngeal Swab; Nasopharyngeal(NP) swabs in vial transport medium  Result Value Ref Range Status   SARS Coronavirus 2 by RT PCR NEGATIVE NEGATIVE Final    Comment: (NOTE) SARS-CoV-2 target nucleic acids are NOT DETECTED.  The SARS-CoV-2 RNA is generally detectable in upper respiratory specimens during the acute phase of infection. The lowest concentration of SARS-CoV-2 viral copies this assay can detect is 138 copies/mL. A negative result does not preclude SARS-Cov-2 infection and should not be used as the sole basis for  treatment or other patient management decisions. A negative result may occur with  improper specimen collection/handling, submission of specimen other than nasopharyngeal swab, presence of viral mutation(s) within the areas targeted by this assay, and inadequate number of viral copies(<138 copies/mL). A negative result must be combined with clinical observations, patient history, and epidemiological information. The expected result is Negative.  Fact Sheet for Patients:  12/21/20  Fact Sheet for Healthcare Providers:  BloggerCourse.com  This test is no t yet approved or cleared by the [place] FDA and  has been authorized for detection and/or diagnosis of SARS-CoV-2 by FDA under an Emergency Use Authorization (EUA). This EUA will remain  in effect (meaning this test can be used) for the duration of the COVID-19 declaration under Section 564(b)(1) of the Act, 21 U.S.C.section 360bbb-3(b)(1), unless the authorization is terminated  or revoked sooner.       Influenza A by PCR NEGATIVE NEGATIVE Final   Influenza B by PCR NEGATIVE NEGATIVE Final    Comment: (NOTE) The Xpert Xpress SARS-CoV-2/FLU/RSV plus assay is intended as an aid in the diagnosis of influenza from Nasopharyngeal swab specimens  and should not be used as a sole basis for treatment. Nasal washings and aspirates are unacceptable for Xpert Xpress SARS-CoV-2/FLU/RSV testing.  Fact Sheet for Patients: BloggerCourse.com  Fact Sheet for Healthcare Providers: SeriousBroker.it  This test is not yet approved or cleared by the [place] FDA and has been authorized for detection and/or diagnosis of SARS-CoV-2 by FDA under an Emergency Use Authorization (EUA). This EUA will remain in effect (meaning this test can be used) for the duration of the COVID-19 declaration under Section 564(b)(1) of the Act, 21  U.S.C. section 360bbb-3(b)(1), unless the authorization is terminated or revoked.  Performed at [facility], [address] [place], [place] [address]          Radiology Studies: DG Abd Portable 1V  Result Date: 12/21/2020 CLINICAL DATA:  Nausea and vomiting. EXAM: PORTABLE ABDOMEN - 1 VIEW COMPARISON:  12/20/2020 FINDINGS: Low lung volumes with vascular crowding and bibasilar atelectasis. Persistent dilated small bowel loops consistent with a small-bowel obstruction. No free air. IMPRESSION: Persistent small-bowel obstruction bowel gas pattern. Electronically Signed   By: Rudie Meyer M.D.   On: 12/21/2020 11:29   DG Abd Portable 1V-Small Bowel Obstruction Protocol-initial, 8 hr delay  Result Date: 12/20/2020 CLINICAL DATA:  Small bowel obstruction.  8 hour delayed image. EXAM: PORTABLE ABDOMEN - 1 VIEW COMPARISON:  CT abdomen pelvis dated 12/21/2020. FINDINGS: Persistent dilatation of small-bowel loops measuring up to 5 cm. No significant contrast noted within the colon. IMPRESSION: Persistent dilatation of small-bowel loops. Electronically Signed   By: Elgie Collard M.D.   On: 12/20/2020 17:59        Scheduled Meds:  heparin  5,000 Units Subcutaneous Q8H   hydrALAZINE  10 mg Intravenous Q8H   insulin aspart  0-15 Units Subcutaneous Q4H   sodium chloride flush  3 mL Intravenous Q12H   Continuous Infusions:  sodium chloride 75 mL/hr at 12/21/20 2312   promethazine (PHENERGAN) injection (IM or IVPB)       LOS: 1 day   Time spent= 35 mins    Hassen Bruun Joline Maxcy, MD [facility]  If 7PM-7AM, please contact night-coverage  12/22/2020, 7:23 AM

## 2020-12-22 NOTE — Plan of Care (Signed)
  Problem: Activity: Goal: Risk for activity intolerance will decrease 12/22/2020 0128 by Virgilio Belling, RN Outcome: Progressing 12/22/2020 0128 by Virgilio Belling, RN Outcome: Progressing   Problem: Coping: Goal: Level of anxiety will decrease Outcome: Progressing   Problem: Pain Managment: Goal: General experience of comfort will improve Outcome: Progressing   Problem: Safety: Goal: Ability to remain free from injury will improve Outcome: Progressing

## 2020-12-22 NOTE — Plan of Care (Signed)
  Problem: Activity: Goal: Risk for activity intolerance will decrease Outcome: Progressing   Problem: Pain Managment: Goal: General experience of comfort will improve Outcome: Progressing   Problem: Safety: Goal: Ability to remain free from injury will improve Outcome: Progressing   

## 2020-12-23 DIAGNOSIS — K56609 Unspecified intestinal obstruction, unspecified as to partial versus complete obstruction: Secondary | ICD-10-CM | POA: Diagnosis not present

## 2020-12-23 LAB — MAGNESIUM: Magnesium: 1.7 mg/dL (ref 1.7–2.4)

## 2020-12-23 LAB — CBC
HCT: 42 % (ref 39.0–52.0)
Hemoglobin: 13.4 g/dL (ref 13.0–17.0)
MCH: 30.9 pg (ref 26.0–34.0)
MCHC: 31.9 g/dL (ref 30.0–36.0)
MCV: 96.8 fL (ref 80.0–100.0)
Platelets: 297 10*3/uL (ref 150–400)
RBC: 4.34 MIL/uL (ref 4.22–5.81)
RDW: 13.9 % (ref 11.5–15.5)
WBC: 7.1 10*3/uL (ref 4.0–10.5)
nRBC: 0 % (ref 0.0–0.2)

## 2020-12-23 LAB — GLUCOSE, CAPILLARY
Glucose-Capillary: 114 mg/dL — ABNORMAL HIGH (ref 70–99)
Glucose-Capillary: 143 mg/dL — ABNORMAL HIGH (ref 70–99)
Glucose-Capillary: 166 mg/dL — ABNORMAL HIGH (ref 70–99)
Glucose-Capillary: 88 mg/dL (ref 70–99)

## 2020-12-23 LAB — BASIC METABOLIC PANEL
Anion gap: 11 (ref 5–15)
BUN: 11 mg/dL (ref 6–20)
CO2: 21 mmol/L — ABNORMAL LOW (ref 22–32)
Calcium: 9 mg/dL (ref 8.9–10.3)
Chloride: 104 mmol/L (ref 98–111)
Creatinine, Ser: 1.03 mg/dL (ref 0.61–1.24)
GFR, Estimated: >60 mL/min (ref >60)
Glucose, Bld: 150 mg/dL — ABNORMAL HIGH (ref 70–99)
Potassium: 3.4 mmol/L — ABNORMAL LOW (ref 3.5–5.1)
Sodium: 136 mmol/L (ref 135–145)

## 2020-12-23 MED ORDER — POTASSIUM CHLORIDE CRYS ER 20 MEQ PO TBCR
40.0000 meq | EXTENDED_RELEASE_TABLET | Freq: Once | ORAL | Status: AC
  Administered 2020-12-23: 10:00:00 40 meq via ORAL
  Filled 2020-12-23: qty 2

## 2020-12-23 MED ORDER — DOCUSATE SODIUM 100 MG PO CAPS
100.0000 mg | ORAL_CAPSULE | Freq: Two times a day (BID) | ORAL | Status: DC
  Administered 2020-12-23: 10:00:00 100 mg via ORAL
  Filled 2020-12-23: qty 1

## 2020-12-23 NOTE — Progress Notes (Signed)
Patient ID: Kenneth Barron, male   DOB: May 07, 1965, 55 y.o.   MRN: [id number] [facility] Surgery Progress Note     Subjective: CC-  States that he had some crampy abdominal pain last night, feeling better so far this morning. Denies n/v. Tolerated full liquids yesterday. Passing flatus and had 2 more Bms yesterday.  Objective: Vital signs in last 24 hours: Temp:  [98 F (36.7 C)-98.8 F (37.1 C)] 98 F (36.7 C) (11/17 0559) Pulse Rate:  [82-99] 99 (11/17 0559) Resp:  [16-18] 16 (11/17 0559) BP: (132-173)/(82-107) 154/94 (11/17 0559) SpO2:  [95 %-100 %] 100 % (11/17 0559) Last BM Date: 12/22/20  Intake/Output from previous day: 11/16 0701 - 11/17 0700 In: 2692.9 [P.O.:1650; I.V.:818.7; IV Piggyback:224.2] Out: -  Intake/Output this shift: No intake/output data recorded.  PE: Gen:  Alert, NAD, pleasant Pulm: rate and effort normal Abd: soft, nondistended, nontender, no masses, hernias, or organomegaly Psych: A&Ox3 Skin: no rashes noted, warm and dry  Lab Results:  Recent Labs    12/22/20 0335 12/23/20 0324  WBC 7.2 7.1  HGB 12.5* 13.4  HCT 38.5* 42.0  PLT 292 297   BMET Recent Labs    12/22/20 0335 12/23/20 0324  NA 139 136  K 3.5 3.4*  CL 107 104  CO2 23 21*  GLUCOSE 102* 150*  BUN 17 11  CREATININE 0.99 1.03  CALCIUM 8.6* 9.0   PT/INR No results for input(s): LABPROT, INR in the last 72 hours. CMP     Component Value Date/Time   NA 136 12/23/2020 0324   K 3.4 (L) 12/23/2020 0324   CL 104 12/23/2020 0324   CO2 21 (L) 12/23/2020 0324   GLUCOSE 150 (H) 12/23/2020 0324   BUN 11 12/23/2020 0324   CREATININE 1.03 12/23/2020 0324   CALCIUM 9.0 12/23/2020 0324   PROT 8.9 (H) 12/19/2020 1928   ALBUMIN 4.9 12/19/2020 1928   AST 22 12/19/2020 1928   ALT 26 12/19/2020 1928   ALKPHOS 105 12/19/2020 1928   BILITOT 0.8 12/19/2020 1928   GFRNONAA >60 12/23/2020 0324   GFRAA >60 04/13/2017 1309   Lipase     Component Value Date/Time   LIPASE  35 12/19/2020 1928       Studies/Results: DG Abd Portable 1V  Result Date: 12/22/2020 CLINICAL DATA:  Small-bowel obstruction. EXAM: PORTABLE ABDOMEN - 1 VIEW COMPARISON:  Prior CT scan radiographs. FINDINGS: Contrast has now moved into the colon. The small bowel loops are less dilated. Findings consistent with a resolving small bowel obstruction. IMPRESSION: Resolving small bowel obstruction. Electronically Signed   By: Rudie Meyer M.D.   On: 12/22/2020 08:02   DG Abd Portable 1V  Result Date: 12/21/2020 CLINICAL DATA:  Nausea and vomiting. EXAM: PORTABLE ABDOMEN - 1 VIEW COMPARISON:  12/20/2020 FINDINGS: Low lung volumes with vascular crowding and bibasilar atelectasis. Persistent dilated small bowel loops consistent with a small-bowel obstruction. No free air. IMPRESSION: Persistent small-bowel obstruction bowel gas pattern. Electronically Signed   By: Rudie Meyer M.D.   On: 12/21/2020 11:29    Anti-infectives: Anti-infectives (From admission, onward)    None        Assessment/Plan SBO - Xray 11/16 showed contrast in colon and resolving SBO - tolerating liquids and having bowel function - Advance to solid food. Continue miralax/colace. Ok for discharge later today if tolerating diet.   ID - none VTE - SCDs, sq heparin FEN - CM diet Foley - none   HTN HLD DM Depression Obesity BMI  43.93 Recent right rotator cuff repair 07/2020 Dr. Shelle Iron   LOS: 2 days    Franne Forts, [facility] Surgery 12/23/2020, 10:23 AM Please see Amion for pager number during day hours 7:00am-4:30pm

## 2020-12-23 NOTE — Discharge Summary (Signed)
Physician Discharge Summary  Kenneth Barron ACZ:[id number] DOB: [birth year] DOA: 12/19/2020  PCP: Malka So., MD  Admit date: 12/19/2020 Discharge date: 12/23/2020  Admitted From: Home Disposition: Home  Recommendations for Outpatient Follow-up:  Follow up with PCP in 1-2 weeks Please obtain BMP/CBC in one week your next doctors visit.  Advised to eat smaller portions at home over next few days and slowly advance diet.    Discharge Condition: Stable CODE STATUS: Full code Diet recommendation: Diabetic  Brief/Interim Summary: 75 with history of DM2, HTN, HLD, asthma, depression admitted to the hospital with complains of nausea vomiting abdominal pain.  Patient was diagnosed with small bowel obstruction.  He was made n.p.o. and general surgery was consulted.  Initially NG tube was deferred as patient denied any vomiting.  He was managed conservatively and over the course of his hospital stay he started tolerating orals without any issues.  He will be discharged today after cleared by general surgery.     Assessment & Plan:   Principal Problem:   Small bowel obstruction (HCC) Active Problems:   Insulin dependent type 2 diabetes mellitus (HCC)   Hypertension associated with diabetes (HCC)   Hyperlipidemia associated with type 2 diabetes mellitus (HCC)   Depression     Small bowel obstruction, improving - Diagnosed on the CT scan, doing well now.  Improved with conservative management.  Tolerating orals without any issues.  He can be discharged today after seen by general surgery.   Hyperlipidemia - Resume home meds   Diabetes mellitus type 2, insulin-dependent - Resume home meds upon discharge   Essential hypertension - IV hydralazine as needed   History of depression - On Zoloft at home       Body mass index is 43.93 kg/m.         Discharge Diagnoses:  Principal Problem:   Small bowel obstruction (HCC) Active Problems:   Insulin dependent type 2  diabetes mellitus (HCC)   Hypertension associated with diabetes (HCC)   Hyperlipidemia associated with type 2 diabetes mellitus (HCC)   Depression      Consultations: General surgery  Subjective: Feeling well no complaints.  Tolerating oral diet.  Passing gas without any issues.  Discharge Exam: Vitals:   12/23/20 0426 12/23/20 0559  BP: 132/82 (!) 154/94  Pulse: 90 99  Resp: 16 16  Temp: 98 F (36.7 C) 98 F (36.7 C)  SpO2: 95% 100%   Vitals:   12/22/20 2008 12/22/20 2237 12/23/20 0426 12/23/20 0559  BP: (!) 157/91 (!) 173/107 132/82 (!) 154/94  Pulse: 92 98 90 99  Resp: 16 17 16 16   Temp: 98.5 F (36.9 C) 98.5 F (36.9 C) 98 F (36.7 C) 98 F (36.7 C)  TempSrc: Oral Oral    SpO2: 97% 100% 95% 100%  Weight:      Height:        General: Pt is alert, awake, not in acute distress Cardiovascular: RRR, S1/S2 +, no rubs, no gallops Respiratory: CTA bilaterally, no wheezing, no rhonchi Abdominal: Soft, NT, ND, bowel sounds + Extremities: no edema, no cyanosis  Discharge Instructions   Allergies as of 12/23/2020       Reactions   Mixed Feathers Shortness Of Breath   Pollen Extract Shortness Of Breath   Other    Refuse blood products   Metformin And Related Diarrhea, Nausea And Vomiting        Medication List     TAKE these medications    alum & mag  hydroxide-simeth 400-400-40 MG/5ML suspension Commonly known as: MAALOX PLUS Take 15 mLs by mouth every 6 (six) hours as needed for indigestion.   amLODipine 10 MG tablet Commonly known as: NORVASC Take 10 mg by mouth daily.   ammonium lactate 12 % lotion Commonly known as: LAC-HYDRIN Apply 1 application topically daily.   aspirin EC 81 MG tablet Take 1 tablet (81 mg total) by mouth daily. Day after surgery   atorvastatin 80 MG tablet Commonly known as: LIPITOR Take 80 mg by mouth daily.   docusate sodium 100 MG capsule Commonly known as: Colace Take 1 capsule (100 mg total) by mouth 2 (two)  times daily as needed for mild constipation.   insulin degludec 200 UNIT/ML FlexTouch Pen Commonly known as: TRESIBA Inject 46 Units into the skin daily.   lisinopril-hydrochlorothiazide 20-25 MG tablet Commonly known as: ZESTORETIC Take 1 tablet by mouth daily.   methocarbamol 500 MG tablet Commonly known as: Robaxin Take 1 tablet (500 mg total) by mouth every 8 (eight) hours as needed for muscle spasms.   metoprolol tartrate 25 MG tablet Commonly known as: LOPRESSOR Take 25 mg by mouth 2 (two) times daily.   Ozempic (0.25 or 0.5 MG/DOSE) 2 MG/1.5ML Sopn Generic drug: Semaglutide(0.25 or 0.5MG /DOS) Inject 0.5 mg into the skin once a week. Sunday   polyethylene glycol 17 g packet Commonly known as: MIRALAX / GLYCOLAX Take 17 g by mouth daily.   sertraline 50 MG tablet Commonly known as: ZOLOFT Take 50 mg by mouth daily.   vitamin B-12 1000 MCG tablet Commonly known as: CYANOCOBALAMIN Take 1,000 mcg by mouth daily.        Follow-up Information     Malka So., MD Follow up in 1 week(s).   Specialty: Internal Medicine Contact information: [address] [place] [address] [phone]                Allergies  Allergen Reactions   Mixed Feathers Shortness Of Breath   Pollen Extract Shortness Of Breath   Other     Refuse blood products   Metformin And Related Diarrhea and Nausea And Vomiting    You were cared for by a hospitalist during your hospital stay. If you have any questions about your discharge medications or the care you received while you were in the hospital after you are discharged, you can call the unit and asked to speak with the hospitalist on call if the hospitalist that took care of you is not available. Once you are discharged, your primary care physician will handle any further medical issues. Please note that no refills for any discharge medications will be authorized once you are discharged, as it is imperative  that you return to your primary care physician (or establish a relationship with a primary care physician if you do not have one) for your aftercare needs so that they can reassess your need for medications and monitor your lab values.   Procedures/Studies: CT ABDOMEN PELVIS W CONTRAST  Addendum Date: 12/19/2020   ADDENDUM REPORT: 12/19/2020 22:01 ADDENDUM: Critical findings were reported to PA Fayrene Helper at 10:01 p.m. Electronically Signed   By: Thornell Sartorius M.D.   On: 12/19/2020 22:01   Result Date: 12/19/2020 CLINICAL DATA:  Abdominal pain, nausea, vomiting. EXAM: CT ABDOMEN AND PELVIS WITH CONTRAST TECHNIQUE: Multidetector CT imaging of the abdomen and pelvis was performed using the standard protocol following bolus administration of intravenous contrast. CONTRAST:  OMNIPAQUE IOHEXOL 350 MG/ML SOLN  COMPARISON:  04/13/2017. FINDINGS: Lower chest: The heart is normal in size and there is no pericardial effusion. A few scattered coronary artery calcifications are noted. Mild atelectasis or infiltrate is noted in the lower lobes bilaterally. Hepatobiliary: No focal liver abnormality is seen. No gallstones, gallbladder wall thickening, or biliary dilatation. Pancreas: Unremarkable. No pancreatic ductal dilatation or surrounding inflammatory changes. Spleen: Normal in size without focal abnormality. Adrenals/Urinary Tract: No adrenal nodule or mass. Evaluation for renal calculus is limited due to excreted contrast at the renal pyramids and collecting systems. There is no hydronephrosis. No focal renal abnormality is identified. The urinary bladder is within normal limits. Stomach/Bowel: Scattered loops of mildly distended small bowel are noted in the abdomen measuring up to 3.4 cm in diameter and air-fluid levels are noted. There is a suspected transition point in the right lower quadrant, best seen on coronal image 96, however evaluation in this region is extremely limited due to streak artifact from  a right hip prosthesis. The small bowel distal to this point is decompressed. Scattered diverticula are present along the colon without evidence of diverticulitis. A normal appendix is noted in the right lower quadrant Vascular/Lymphatic: Aortic atherosclerosis. No enlarged abdominal or pelvic lymph nodes. Reproductive: Prostate is unremarkable. Other: No free fluid. 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 Degenerative changes are present in the thoracic spine. No acute osseous abnormality is identified. IMPRESSION: Dilated loops of small bowel in the abdomen with scattered air-fluid levels, suspicious for small bowel obstruction. There is a suspected transition point in the right lower quadrant, not well evaluated due to streak hardware artifact. The small bowel is collapsed distal to this point. Surgical consultation is recommended. Electronically Signed: By: Thornell Sartorius M.D. On: 12/19/2020 21:57   DG Abd Portable 1V  Result Date: 12/22/2020 CLINICAL DATA:  Small-bowel obstruction. EXAM: PORTABLE ABDOMEN - 1 VIEW COMPARISON:  Prior CT scan radiographs. FINDINGS: Contrast has now moved into the colon. The small bowel loops are less dilated. Findings consistent with a resolving small bowel obstruction. IMPRESSION: Resolving small bowel obstruction. Electronically Signed   By: Rudie Meyer M.D.   On: 12/22/2020 08:02   DG Abd Portable 1V  Result Date: 12/21/2020 CLINICAL DATA:  Nausea and vomiting. EXAM: PORTABLE ABDOMEN - 1 VIEW COMPARISON:  12/20/2020 FINDINGS: Low lung volumes with vascular crowding and bibasilar atelectasis. Persistent dilated small bowel loops consistent with a small-bowel obstruction. No free air. IMPRESSION: Persistent small-bowel obstruction bowel gas pattern. Electronically Signed   By: Rudie Meyer M.D.   On: 12/21/2020 11:29   DG Abd Portable 1V-Small Bowel Obstruction Protocol-initial, 8 hr delay  Result Date: 12/20/2020 CLINICAL DATA:   Small bowel obstruction.  8 hour delayed image. EXAM: PORTABLE ABDOMEN - 1 VIEW COMPARISON:  CT abdomen pelvis dated 12/21/2020. FINDINGS: Persistent dilatation of small-bowel loops measuring up to 5 cm. No significant contrast noted within the colon. IMPRESSION: Persistent dilatation of small-bowel loops. Electronically Signed   By: Elgie Collard M.D.   On: 12/20/2020 17:59     The results of significant diagnostics from this hospitalization (including imaging, microbiology, ancillary and laboratory) are listed below for reference.     Microbiology: Recent Results (from the past 240 hour(s))  Resp Panel by RT-PCR (Flu A&B, Covid) Nasopharyngeal Swab     Status: None   Collection Time: 12/19/20  7:05 PM   Specimen: Nasopharyngeal Swab; Nasopharyngeal(NP) swabs in vial transport medium  Result Value Ref Range Status   SARS Coronavirus 2  by RT PCR NEGATIVE NEGATIVE Final    Comment: (NOTE) SARS-CoV-2 target nucleic acids are NOT DETECTED.  The SARS-CoV-2 RNA is generally detectable in upper respiratory specimens during the acute phase of infection. The lowest concentration of SARS-CoV-2 viral copies this assay can detect is 138 copies/mL. A negative result does not preclude SARS-Cov-2 infection and should not be used as the sole basis for treatment or other patient management decisions. A negative result may occur with  improper specimen collection/handling, submission of specimen other than nasopharyngeal swab, presence of viral mutation(s) within the areas targeted by this assay, and inadequate number of viral copies(<138 copies/mL). A negative result must be combined with clinical observations, patient history, and epidemiological information. The expected result is Negative.  Fact Sheet for Patients:  BloggerCourse.com  Fact Sheet for Healthcare Providers:  SeriousBroker.it  This test is no t yet approved or cleared by the  [place] FDA and  has been authorized for detection and/or diagnosis of SARS-CoV-2 by FDA under an Emergency Use Authorization (EUA). This EUA will remain  in effect (meaning this test can be used) for the duration of the COVID-19 declaration under Section 564(b)(1) of the Act, 21 U.S.C.section 360bbb-3(b)(1), unless the authorization is terminated  or revoked sooner.       Influenza A by PCR NEGATIVE NEGATIVE Final   Influenza B by PCR NEGATIVE NEGATIVE Final    Comment: (NOTE) The Xpert Xpress SARS-CoV-2/FLU/RSV plus assay is intended as an aid in the diagnosis of influenza from Nasopharyngeal swab specimens and should not be used as a sole basis for treatment. Nasal washings and aspirates are unacceptable for Xpert Xpress SARS-CoV-2/FLU/RSV testing.  Fact Sheet for Patients: BloggerCourse.com  Fact Sheet for Healthcare Providers: SeriousBroker.it  This test is not yet approved or cleared by the [place] FDA and has been authorized for detection and/or diagnosis of SARS-CoV-2 by FDA under an Emergency Use Authorization (EUA). This EUA will remain in effect (meaning this test can be used) for the duration of the COVID-19 declaration under Section 564(b)(1) of the Act, 21 U.S.C. section 360bbb-3(b)(1), unless the authorization is terminated or revoked.  Performed at [facility], [address] [place], [place] [address]      Labs: BNP (last 3 results) No results for input(s): BNP in the last 8760 hours. Basic Metabolic Panel: Recent Labs  Lab 12/19/20 1928 12/20/20 0331 12/22/20 0335 12/23/20 0324  NA 134* 137 139 136  K 3.4* 5.0 3.5 3.4*  CL 99 101 107 104  CO2 20* 25 23 21*  GLUCOSE 219* 165* 102* 150*  BUN 19 20 17 11   CREATININE 0.91 0.91 0.99 1.03  CALCIUM 10.3 9.0 8.6* 9.0  MG  --  1.8  --  1.7   Liver Function Tests: Recent Labs  Lab 12/19/20 1928  AST 22  ALT 26   ALKPHOS 105  BILITOT 0.8  PROT 8.9*  ALBUMIN 4.9   Recent Labs  Lab 12/19/20 1928  LIPASE 35   No results for input(s): AMMONIA in the last 168 hours. CBC: Recent Labs  Lab 12/19/20 1928 12/20/20 0331 12/22/20 0335 12/23/20 0324  WBC 14.1* 9.9 7.2 7.1  NEUTROABS 11.4*  --   --   --   HGB 16.1 14.5 12.5* 13.4  HCT 48.6 44.1 38.5* 42.0  MCV 92.6 93.6 96.0 96.8  PLT 406* 363 292 297   Cardiac Enzymes: No results for input(s): CKTOTAL, CKMB, CKMBINDEX, TROPONINI in the last 168 hours. BNP: Invalid input(s):  POCBNP CBG: Recent Labs  Lab 12/22/20 1655 12/22/20 2005 12/23/20 0018 12/23/20 0423 12/23/20 0730  GLUCAP 120* 127* 114* 166* 88   D-Dimer No results for input(s): DDIMER in the last 72 hours. Hgb A1c No results for input(s): HGBA1C in the last 72 hours. Lipid Profile No results for input(s): CHOL, HDL, LDLCALC, TRIG, CHOLHDL, LDLDIRECT in the last 72 hours. Thyroid function studies No results for input(s): TSH, T4TOTAL, T3FREE, THYROIDAB in the last 72 hours.  Invalid input(s): FREET3 Anemia work up No results for input(s): VITAMINB12, FOLATE, FERRITIN, TIBC, IRON, RETICCTPCT in the last 72 hours. Urinalysis    Component Value Date/Time   COLORURINE YELLOW 04/13/2017 1520   APPEARANCEUR CLEAR 04/13/2017 1520   LABSPEC 1.025 04/13/2017 1520   PHURINE 5.0 04/13/2017 1520   GLUCOSEU NEGATIVE 04/13/2017 1520   HGBUR NEGATIVE 04/13/2017 1520   BILIRUBINUR NEGATIVE 04/13/2017 1520   KETONESUR NEGATIVE 04/13/2017 1520   PROTEINUR NEGATIVE 04/13/2017 1520   NITRITE NEGATIVE 04/13/2017 1520   LEUKOCYTESUR NEGATIVE 04/13/2017 1520   Sepsis Labs Invalid input(s): PROCALCITONIN,  WBC,  LACTICIDVEN Microbiology Recent Results (from the past 240 hour(s))  Resp Panel by RT-PCR (Flu A&B, Covid) Nasopharyngeal Swab     Status: None   Collection Time: 12/19/20  7:05 PM   Specimen: Nasopharyngeal Swab; Nasopharyngeal(NP) swabs in vial transport medium  Result  Value Ref Range Status   SARS Coronavirus 2 by RT PCR NEGATIVE NEGATIVE Final    Comment: (NOTE) SARS-CoV-2 target nucleic acids are NOT DETECTED.  The SARS-CoV-2 RNA is generally detectable in upper respiratory specimens during the acute phase of infection. The lowest concentration of SARS-CoV-2 viral copies this assay can detect is 138 copies/mL. A negative result does not preclude SARS-Cov-2 infection and should not be used as the sole basis for treatment or other patient management decisions. A negative result may occur with  improper specimen collection/handling, submission of specimen other than nasopharyngeal swab, presence of viral mutation(s) within the areas targeted by this assay, and inadequate number of viral copies(<138 copies/mL). A negative result must be combined with clinical observations, patient history, and epidemiological information. The expected result is Negative.  Fact Sheet for Patients:  BloggerCourse.com  Fact Sheet for Healthcare Providers:  SeriousBroker.it  This test is no t yet approved or cleared by the [place] FDA and  has been authorized for detection and/or diagnosis of SARS-CoV-2 by FDA under an Emergency Use Authorization (EUA). This EUA will remain  in effect (meaning this test can be used) for the duration of the COVID-19 declaration under Section 564(b)(1) of the Act, 21 U.S.C.section 360bbb-3(b)(1), unless the authorization is terminated  or revoked sooner.       Influenza A by PCR NEGATIVE NEGATIVE Final   Influenza B by PCR NEGATIVE NEGATIVE Final    Comment: (NOTE) The Xpert Xpress SARS-CoV-2/FLU/RSV plus assay is intended as an aid in the diagnosis of influenza from Nasopharyngeal swab specimens and should not be used as a sole basis for treatment. Nasal washings and aspirates are unacceptable for Xpert Xpress SARS-CoV-2/FLU/RSV testing.  Fact Sheet for  Patients: BloggerCourse.com  Fact Sheet for Healthcare Providers: SeriousBroker.it  This test is not yet approved or cleared by the [place] FDA and has been authorized for detection and/or diagnosis of SARS-CoV-2 by FDA under an Emergency Use Authorization (EUA). This EUA will remain in effect (meaning this test can be used) for the duration of the COVID-19 declaration under Section 564(b)(1) of the Act, 21 U.S.C. section 360bbb-3(b)(1),  unless the authorization is terminated or revoked.  Performed at [facility], [address], [place], [place] [address]      Time coordinating discharge:  I have spent 35 minutes face to face with the patient and on the ward discussing the patients care, assessment, plan and disposition with other care givers. >50% of the time was devoted counseling the patient about the risks and benefits of treatment/Discharge disposition and coordinating care.   SIGNED:   Dimple Nanas, MD  [facility] 12/23/2020, 9:40 AM   If 7PM-7AM, please contact night-coverage

## 2020-12-23 NOTE — Progress Notes (Signed)
Patient discharged to home via ride share. Verbalized understanding of all instructions. All belongings w/ patient.

## 2022-03-09 NOTE — Progress Notes (Incomplete)
[facility] Note  03/21/2022     CHIEF COMPLAINT Patient presents for No chief complaint on file.   HISTORY OF PRESENT ILLNESS: Kenneth Barron is a 57 y.o. male who presents to the clinic today for:     Referring physician: Lilian Coma., MD [address] [place],  [place] [address]  HISTORICAL INFORMATION:   Selected notes from the MEDICAL RECORD NUMBER Referred by Dr. Truman Hayward:  Ocular Hx- PMH-    CURRENT MEDICATIONS: No current outpatient medications on file. (Ophthalmic Drugs)   No current facility-administered medications for this visit. (Ophthalmic Drugs)   Current Outpatient Medications (Other)  Medication Sig   alum & mag hydroxide-simeth (MAALOX PLUS) 400-400-40 MG/5ML suspension Take 15 mLs by mouth every 6 (six) hours as needed for indigestion.   amLODipine (NORVASC) 10 MG tablet Take 10 mg by mouth daily.   ammonium lactate (LAC-HYDRIN) 12 % lotion Apply 1 application topically daily.   aspirin EC 81 MG tablet Take 1 tablet (81 mg total) by mouth daily. Day after surgery   atorvastatin (LIPITOR) 80 MG tablet Take 80 mg by mouth daily.   docusate sodium (COLACE) 100 MG capsule Take 1 capsule (100 mg total) by mouth 2 (two) times daily as needed for mild constipation.   lisinopril-hydrochlorothiazide (PRINZIDE,ZESTORETIC) 20-25 MG tablet Take 1 tablet by mouth daily.   methocarbamol (ROBAXIN) 500 MG tablet Take 1 tablet (500 mg total) by mouth every 8 (eight) hours as needed for muscle spasms.   metoprolol tartrate (LOPRESSOR) 25 MG tablet Take 25 mg by mouth 2 (two) times daily.   polyethylene glycol (MIRALAX / GLYCOLAX) 17 g packet Take 17 g by mouth daily.   Semaglutide,0.25 or 0.5MG /DOS, (OZEMPIC, 0.25 OR 0.5 MG/DOSE,) 2 MG/1.5ML SOPN Inject 0.5 mg into the skin once a week. Sunday   sertraline (ZOLOFT) 50 MG tablet Take 50 mg by mouth daily.   vitamin B-12 (CYANOCOBALAMIN) 1000 MCG tablet Take 1,000 mcg by  mouth daily. (Patient not taking: Reported on 12/19/2020)   No current facility-administered medications for this visit. (Other)      REVIEW OF SYSTEMS:    ALLERGIES Allergies  Allergen Reactions   Mixed Feathers Shortness Of Breath   Pollen Extract Shortness Of Breath   Other     Refuse blood products   Metformin And Related Diarrhea and Nausea And Vomiting    PAST MEDICAL HISTORY Past Medical History:  Diagnosis Date   Arthritis    Asthma    Diabetes mellitus without complication ([place])    Type 2   Hypertension    Pneumonia    Past Surgical History:  Procedure Laterality Date   HIP SURGERY     2 surgeries   ROTATOR CUFF REPAIR Bilateral    SHOULDER ARTHROSCOPY WITH ROTATOR CUFF REPAIR AND SUBACROMIAL DECOMPRESSION Right 07/23/2020   Procedure: Right shoulder arthroscopy revision rotator cuff repair, subacromial decompression, distal clavicle resection, possible patch graft;  Surgeon: Susa Day, MD;  Location: [facility];  Service: Orthopedics;  Laterality: Right;  7mins    FAMILY HISTORY Family History  Problem Relation Age of Onset   Diabetes Mother    Diabetes Father     SOCIAL HISTORY Social History   Tobacco Use   Smoking status: Never   Smokeless tobacco: Never  Vaping Use   Vaping Use: Never used  Substance Use Topics   Alcohol use: Never   Drug use: Yes    Types: Marijuana  OPHTHALMIC EXAM:  Not recorded     IMAGING AND PROCEDURES  Imaging and Procedures for 03/21/2022           ASSESSMENT/PLAN:  No diagnosis found.  1.  2.  3.  Ophthalmic Meds Ordered this visit:  No orders of the defined types were placed in this encounter.      No follow-ups on file.  There are no Patient Instructions on file for this visit.   Explained the diagnoses, plan, and follow up with the patient and they expressed understanding.  Patient expressed understanding of the importance of proper follow up care.   This document  serves as a record of services personally performed by Gardiner Sleeper, MD, PhD. It was created on their behalf by Renaldo Reel, [place] an ophthalmic technician. The creation of this record is the provider's dictation and/or activities during the visit.    Electronically signed by:  Renaldo Reel, COT  02.01.24 9:57 AM  Gardiner Sleeper, M.D., Ph.D. Diseases & Surgery of the Retina and Vitreous [facility] @TODAY @     Abbreviations: M myopia (nearsighted); A astigmatism; H hyperopia (farsighted); P presbyopia; Mrx spectacle prescription;  CTL contact lenses; OD right eye; OS left eye; OU both eyes  XT exotropia; ET esotropia; PEK punctate epithelial keratitis; PEE punctate epithelial erosions; DES dry eye syndrome; MGD meibomian gland dysfunction; ATs artificial tears; PFAT's preservative free artificial tears; [place] nuclear sclerotic cataract; PSC posterior subcapsular cataract; ERM epi-retinal membrane; PVD posterior vitreous detachment; RD retinal detachment; DM diabetes mellitus; DR diabetic retinopathy; NPDR non-proliferative diabetic retinopathy; PDR proliferative diabetic retinopathy; CSME clinically significant macular edema; DME diabetic macular edema; dbh dot blot hemorrhages; CWS cotton wool spot; POAG primary open angle glaucoma; C/D cup-to-disc ratio; HVF humphrey visual field; GVF goldmann visual field; OCT optical coherence tomography; IOP intraocular pressure; BRVO Branch retinal vein occlusion; CRVO central retinal vein occlusion; CRAO central retinal artery occlusion; BRAO branch retinal artery occlusion; RT retinal tear; SB scleral buckle; PPV pars plana vitrectomy; VH Vitreous hemorrhage; PRP panretinal laser photocoagulation; IVK intravitreal kenalog; VMT vitreomacular traction; MH Macular hole;  NVD neovascularization of the disc; NVE neovascularization elsewhere; AREDS age related eye disease study; ARMD age related macular degeneration; POAG primary  open angle glaucoma; EBMD epithelial/anterior basement membrane dystrophy; ACIOL anterior chamber intraocular lens; IOL intraocular lens; PCIOL posterior chamber intraocular lens; Phaco/IOL phacoemulsification with intraocular lens placement; [place] photorefractive keratectomy; LASIK laser assisted in situ keratomileusis; HTN hypertension; DM diabetes mellitus; COPD chronic obstructive pulmonary disease

## 2022-03-21 ENCOUNTER — Encounter (INDEPENDENT_AMBULATORY_CARE_PROVIDER_SITE_OTHER): Payer: Federal, State, Local not specified - PPO | Admitting: Ophthalmology

## 2022-03-24 ENCOUNTER — Encounter (INDEPENDENT_AMBULATORY_CARE_PROVIDER_SITE_OTHER): Payer: Self-pay | Admitting: Ophthalmology

## 2022-03-24 ENCOUNTER — Ambulatory Visit (INDEPENDENT_AMBULATORY_CARE_PROVIDER_SITE_OTHER): Payer: Federal, State, Local not specified - PPO | Admitting: Ophthalmology

## 2022-03-24 DIAGNOSIS — I10 Essential (primary) hypertension: Secondary | ICD-10-CM | POA: Diagnosis not present

## 2022-03-24 DIAGNOSIS — H35033 Hypertensive retinopathy, bilateral: Secondary | ICD-10-CM

## 2022-03-24 DIAGNOSIS — H25813 Combined forms of age-related cataract, bilateral: Secondary | ICD-10-CM | POA: Diagnosis not present

## 2022-03-24 DIAGNOSIS — E119 Type 2 diabetes mellitus without complications: Secondary | ICD-10-CM

## 2022-03-24 NOTE — Progress Notes (Addendum)
[facility] Note  03/24/2022     CHIEF COMPLAINT Patient presents for Retina Evaluation   HISTORY OF PRESENT ILLNESS: Kenneth Barron is a 57 y.o. male who presents to the clinic today for:   HPI     Retina Evaluation   In both eyes.  This started 3 years ago.  Associated Symptoms Floaters.  I, the attending physician,  performed the HPI with the patient and updated documentation appropriately.        Comments   New pt referred by Dr. Valora Piccolo for diabetic retina eval. Pt states no recent changes in [place], has been 3 years since his last eye exam. Rx specs 57 years old. Pt reports being diabetic since 2017/2018. Most recent A1C reported at 10.2. Blood sugar has been running around 178 in the mornings. Pt reports floaters, has had them for years. No fol.       Last edited by Bernarda Caffey, MD on 03/24/2022 12:04 PM.    Pt is here on the referral of Dr. Valora Piccolo, his PCP, for a DM exam, pt states he hasn't gotten new glasses in about 3 years, he last saw Dr. Marin Comment for a routine eye exam, he states his eyes water a lot, his last A1c was 10.3 on 01.25.24, he takes Ozempic and tresiba for diabetes, his fasting blood sugar is in the high 170's, but he states it used to be around 119, pt denies kidney damage or peripheral neuropathy   Referring physician: Lilian Coma., MD [address] [place],  [place] [address]  HISTORICAL INFORMATION:   Selected notes from the MEDICAL RECORD NUMBER Referred by Dr. Valora Piccolo for DM exam LEE:  Ocular Hx- PMH-    CURRENT MEDICATIONS: No current outpatient medications on file. (Ophthalmic Drugs)   No current facility-administered medications for this visit. (Ophthalmic Drugs)   Current Outpatient Medications (Other)  Medication Sig   amLODipine (NORVASC) 10 MG tablet Take 10 mg by mouth daily.   Insulin Degludec (TRESIBA) 100 UNIT/ML SOLN Inject 70 Units into the skin daily.   lisinopril-hydrochlorothiazide  (PRINZIDE,ZESTORETIC) 20-25 MG tablet Take 1 tablet by mouth daily.   methocarbamol (ROBAXIN) 500 MG tablet Take 1 tablet (500 mg total) by mouth every 8 (eight) hours as needed for muscle spasms.   metoprolol tartrate (LOPRESSOR) 25 MG tablet Take 25 mg by mouth 2 (two) times daily.   Semaglutide,0.25 or 0.5MG/DOS, (OZEMPIC, 0.25 OR 0.5 MG/DOSE,) 2 MG/1.5ML SOPN Inject 0.5 mg into the skin once a week. Sunday   sertraline (ZOLOFT) 50 MG tablet Take 50 mg by mouth daily.   alum & mag hydroxide-simeth (MAALOX PLUS) 400-400-40 MG/5ML suspension Take 15 mLs by mouth every 6 (six) hours as needed for indigestion.   ammonium lactate (LAC-HYDRIN) 12 % lotion Apply 1 application topically daily.   aspirin EC 81 MG tablet Take 1 tablet (81 mg total) by mouth daily. Day after surgery   atorvastatin (LIPITOR) 80 MG tablet Take 80 mg by mouth daily.   docusate sodium (COLACE) 100 MG capsule Take 1 capsule (100 mg total) by mouth 2 (two) times daily as needed for mild constipation.   polyethylene glycol (MIRALAX / GLYCOLAX) 17 g packet Take 17 g by mouth daily.   vitamin B-12 (CYANOCOBALAMIN) 1000 MCG tablet Take 1,000 mcg by mouth daily. (Patient not taking: Reported on 12/19/2020)   No current facility-administered medications for this visit. (Other)   REVIEW OF SYSTEMS: ROS   Positive for:  Musculoskeletal, Endocrine, Cardiovascular, Eyes, Psychiatric Negative for: Constitutional, Gastrointestinal, Neurological, Skin, Genitourinary, HENT, Respiratory, Allergic/Imm, Heme/Lymph Last edited by Kingsley Spittle, COT on 03/24/2022  8:01 AM.     ALLERGIES Allergies  Allergen Reactions   Mixed Feathers Shortness Of Breath   Pollen Extract Shortness Of Breath   Other     Refuse blood products   Metformin And Related Diarrhea and Nausea And Vomiting   PAST MEDICAL HISTORY Past Medical History:  Diagnosis Date   Arthritis    Asthma    Diabetes mellitus without complication ([place])    Type 2    Hypertension    Pneumonia    Past Surgical History:  Procedure Laterality Date   HIP SURGERY     2 surgeries   ROTATOR CUFF REPAIR Bilateral    SHOULDER ARTHROSCOPY WITH ROTATOR CUFF REPAIR AND SUBACROMIAL DECOMPRESSION Right 07/23/2020   Procedure: Right shoulder arthroscopy revision rotator cuff repair, subacromial decompression, distal clavicle resection, possible patch graft;  Surgeon: Susa Day, MD;  Location: [facility];  Service: Orthopedics;  Laterality: Right;  84mns   FAMILY HISTORY Family History  Problem Relation Age of Onset   Glaucoma Mother    Diabetes Mother    Glaucoma Father    Cataracts Father    Diabetes Father    SOCIAL HISTORY Social History   Tobacco Use   Smoking status: Never   Smokeless tobacco: Never  Vaping Use   Vaping Use: Never used  Substance Use Topics   Alcohol use: Never   Drug use: Yes    Types: Marijuana       OPHTHALMIC EXAM:  Base Eye Exam     Visual Acuity (Snellen - Linear)       Right Left   Dist cc 20/40 20/25   Dist ph cc 20/25 +2 20/20 -2    Correction: Glasses         Tonometry (Tonopen, 8:15 AM)       Right Left   Pressure 14 15         Pupils       Pupils Dark Light Shape React APD   Right PERRL 4 3 Round Brisk None   Left PERRL 4 3 Round Brisk None         Visual Fields (Counting fingers)       Left Right    Full Full         Extraocular Movement       Right Left    Full, Ortho Full, Ortho         Dilation     Both eyes: 1.0% Mydriacyl, 2.5% Phenylephrine @ 8:16 AM           Slit Lamp and Fundus Exam     Slit Lamp Exam       Right Left   Lids/Lashes Normal Normal   Conjunctiva/Sclera White and quiet White and quiet   Cornea 2-3+ fine Punctate epithelial erosions 2+ fine Punctate epithelial erosions   Anterior Chamber deep and clear deep and clear   Iris Round and dilated, No NVI Round and dilated, No NVI   Lens 2+ Nuclear sclerosis, 2+ Cortical cataract 2+ Nuclear  sclerosis, 2+ Cortical cataract   Anterior Vitreous mild syneresis mild syneresis         Fundus Exam       Right Left   Disc Pink and Sharp, mild PPA Pink and Sharp   C/D Ratio 0.1 0.1   Macula Flat, Good foveal  reflex, No heme or edema Flat, Good foveal reflex, No heme or edema   Vessels attenuated, Tortuous, mild AV crossing changes attenuated, Tortuous, mild AV crossing changes   Periphery Attached, prominent vitreous base vs shallow schisis nasally, No heme Attached, No heme           Refraction     Wearing Rx       Sphere Cylinder Axis   Right +6.75 +0.50 105   Left +6.25 +0.75 075         Manifest Refraction       Sphere Cylinder Axis Dist VA   Right +6.25 +0.25 095 20/25+2   Left               IMAGING AND PROCEDURES  Imaging and Procedures for 03/24/2022  OCT, Retina - OU - Both Eyes       Right Eye Quality was good. Central Foveal Thickness: 254. Progression has no prior data. Findings include normal foveal contour, no IRF, no SRF, vitreomacular adhesion .   Left Eye Quality was good. Central Foveal Thickness: 257. Progression has no prior data. Findings include normal foveal contour, no IRF, no SRF, vitreomacular adhesion .   Notes *Images captured and stored on drive  Diagnosis / Impression:  NFP; no IRF/SRF OU No DME OU  Clinical management:  See below  Abbreviations: NFP - Normal foveal profile. CME - cystoid macular edema. PED - pigment epithelial detachment. IRF - intraretinal fluid. SRF - subretinal fluid. EZ - ellipsoid zone. ERM - epiretinal membrane. ORA - outer retinal atrophy. ORT - outer retinal tubulation. SRHM - subretinal hyper-reflective material. IRHM - intraretinal hyper-reflective material            ASSESSMENT/PLAN:    ICD-10-CM   1. Diabetes mellitus type 2 without retinopathy ([place])  E11.9 OCT, Retina - OU - Both Eyes    2. Essential hypertension  I10     3. Hypertensive retinopathy of both eyes  H35.033      4. Combined forms of age-related cataract of both eyes  H25.813      Diabetes mellitus, type 2 without retinopathy - A1c 10.3 on 01.25.24 - currently on Ozempic and Tresiba -- awaiting consult with Endocrinology - The incidence, risk factors for progression, natural history and treatment options for diabetic retinopathy  were discussed with patient.   - The need for close monitoring of blood glucose, blood pressure, and serum lipids, avoiding cigarette or any type of tobacco, and the need for long term follow up was also discussed with patient. - f/u in 1 year, sooner prn  2,3. Hypertensive retinopathy OU - discussed importance of tight BP control - monitor  4. Mixed Cataract OU - The symptoms of cataract, surgical options, and treatments and risks were discussed with patient. - discussed diagnosis and progression - not yet visually significant - monitor  Ophthalmic Meds Ordered this visit:  No orders of the defined types were placed in this encounter.    Return in about 1 year (around 03/25/2023) for DM exam, DFE, OCT.  There are no Patient Instructions on file for this visit.   Explained the diagnoses, plan, and follow up with the patient and they expressed understanding.  Patient expressed understanding of the importance of proper follow up care.   This document serves as a record of services personally performed by Gardiner Sleeper, MD, PhD. It was created on their behalf by San Jetty. Owens Shark, OA an ophthalmic technician. The creation of this record  is the provider's dictation and/or activities during the visit.    Electronically signed by: San Jetty. Owens Shark, [place] 02.16.2024 12:12 PM  Gardiner Sleeper, M.D., Ph.D. Diseases & Surgery of the Retina and Vitreous [facility]  I have reviewed the above documentation for accuracy and completeness, and I agree with the above. Gardiner Sleeper, M.D., Ph.D. 03/24/22 12:12 PM  Abbreviations: M myopia (nearsighted);  A astigmatism; H hyperopia (farsighted); P presbyopia; Mrx spectacle prescription;  CTL contact lenses; OD right eye; OS left eye; OU both eyes  XT exotropia; ET esotropia; PEK punctate epithelial keratitis; PEE punctate epithelial erosions; DES dry eye syndrome; MGD meibomian gland dysfunction; ATs artificial tears; PFAT's preservative free artificial tears; [place] nuclear sclerotic cataract; PSC posterior subcapsular cataract; ERM epi-retinal membrane; PVD posterior vitreous detachment; RD retinal detachment; DM diabetes mellitus; DR diabetic retinopathy; NPDR non-proliferative diabetic retinopathy; PDR proliferative diabetic retinopathy; CSME clinically significant macular edema; DME diabetic macular edema; dbh dot blot hemorrhages; CWS cotton wool spot; POAG primary open angle glaucoma; C/D cup-to-disc ratio; HVF humphrey visual field; GVF goldmann visual field; OCT optical coherence tomography; IOP intraocular pressure; BRVO Branch retinal vein occlusion; CRVO central retinal vein occlusion; CRAO central retinal artery occlusion; BRAO branch retinal artery occlusion; RT retinal tear; SB scleral buckle; PPV pars plana vitrectomy; VH Vitreous hemorrhage; PRP panretinal laser photocoagulation; IVK intravitreal kenalog; VMT vitreomacular traction; MH Macular hole;  NVD neovascularization of the disc; NVE neovascularization elsewhere; AREDS age related eye disease study; ARMD age related macular degeneration; POAG primary open angle glaucoma; EBMD epithelial/anterior basement membrane dystrophy; ACIOL anterior chamber intraocular lens; IOL intraocular lens; PCIOL posterior chamber intraocular lens; Phaco/IOL phacoemulsification with intraocular lens placement; [place] photorefractive keratectomy; LASIK laser assisted in situ keratomileusis; HTN hypertension; DM diabetes mellitus; COPD chronic obstructive pulmonary disease

## 2022-08-02 ENCOUNTER — Ambulatory Visit (INDEPENDENT_AMBULATORY_CARE_PROVIDER_SITE_OTHER): Payer: Federal, State, Local not specified - PPO | Admitting: Primary Care

## 2022-08-02 ENCOUNTER — Encounter: Payer: Self-pay | Admitting: Primary Care

## 2022-08-02 VITALS — BP 138/86 | HR 71 | Temp 99.5°F | Ht 71.0 in | Wt 330.2 lb

## 2022-08-02 DIAGNOSIS — Z8669 Personal history of other diseases of the nervous system and sense organs: Secondary | ICD-10-CM | POA: Diagnosis not present

## 2022-08-02 DIAGNOSIS — R0683 Snoring: Secondary | ICD-10-CM | POA: Insufficient documentation

## 2022-08-02 NOTE — Progress Notes (Signed)
@Patient  ID: Kenneth Barron, male    DOB: 1965/09/09, 57 y.o.   MRN: [id number]  Chief Complaint  Patient presents with   Consult    Dx Sleep apnea in 2015. Snoring, waking up gasping for air, fatigue during day.    Referring provider: Modesta Messing  HPI: 57 year old male, never smoked.  Past medical history significant for hypertension, type 2 diabetes, hyperlipidemia, small bowel obstruction.  08/02/2022 Patient presents today for sleep consult. Hx sleep apnea, previous intolerant to CPAP due to mask issues. He is very claustrophobic. He continues to have poor sleep and daytime sleepiness. He has symptoms of snoring and waking up gasping for air. He falls asleep easily during the day. Sleep schedule varies. Weight fluctuates between 240-320lbs. He has gained weight recently. Interested in improving quality of life. He is open to re-trying CPAP if needed. No concern for narcolepsy, cataplexy or sleepwalking.  Sleep questionnaire Symptoms- Snoring, waking up gasping  Prior sleep study- > 10 years ago, not on file (Done in [place])   Bedtime-12AM Time to fall asleep- Varies  Nocturnal awakenings- 3 times  Out of bed/start of day- varies  Weight changes- 80 lbs  Do you operate heavy machinery- no Do you currently wear CPAP- no Do you current wear oxygen- no Epworth- 10  Allergies  Allergen Reactions   Mixed Feathers Shortness Of Breath   Pollen Extract Shortness Of Breath   Other     Refuse blood products   Metformin And Related Diarrhea and Nausea And Vomiting    Immunization History  Administered Date(s) Administered   PFIZER(Purple Top)SARS-COV-2 Vaccination 05/23/2019    Past Medical History:  Diagnosis Date   Arthritis    Asthma    Diabetes mellitus without complication (HCC)    Type 2   Hypertension    Pneumonia     Tobacco History: Social History   Tobacco Use  Smoking Status Never  Smokeless Tobacco Never   Counseling given: Not  Answered   Outpatient Medications Prior to Visit  Medication Sig Dispense Refill   amLODipine (NORVASC) 10 MG tablet Take 10 mg by mouth daily.  2   gabapentin (NEURONTIN) 300 MG capsule Take 300 mg by mouth 2 (two) times daily.     Insulin Degludec (TRESIBA) 100 UNIT/ML SOLN Inject 65 Units into the skin daily.     lisinopril-hydrochlorothiazide (PRINZIDE,ZESTORETIC) 20-25 MG tablet Take 1 tablet by mouth daily.  1   methocarbamol (ROBAXIN) 500 MG tablet Take 1 tablet (500 mg total) by mouth every 8 (eight) hours as needed for muscle spasms. 40 tablet 1   metoprolol tartrate (LOPRESSOR) 25 MG tablet Take 25 mg by mouth 2 (two) times daily.     Semaglutide,0.25 or 0.5MG /DOS, (OZEMPIC, 0.25 OR 0.5 MG/DOSE,) 2 MG/1.5ML SOPN Inject 1 mg into the skin once a week. Sunday     sertraline (ZOLOFT) 50 MG tablet Take 50 mg by mouth daily.     alum & mag hydroxide-simeth (MAALOX PLUS) 400-400-40 MG/5ML suspension Take 15 mLs by mouth every 6 (six) hours as needed for indigestion. (Patient not taking: Reported on 08/02/2022) 355 mL 0   ammonium lactate (LAC-HYDRIN) 12 % lotion Apply 1 application topically daily. (Patient not taking: Reported on 08/02/2022)     aspirin EC 81 MG tablet Take 1 tablet (81 mg total) by mouth daily. Day after surgery (Patient not taking: Reported on 08/02/2022) 21 tablet 1   atorvastatin (LIPITOR) 80 MG tablet Take 80 mg by mouth daily. (Patient  not taking: Reported on 08/02/2022)     docusate sodium (COLACE) 100 MG capsule Take 1 capsule (100 mg total) by mouth 2 (two) times daily as needed for mild constipation. (Patient not taking: Reported on 08/02/2022) 30 capsule 1   polyethylene glycol (MIRALAX / GLYCOLAX) 17 g packet Take 17 g by mouth daily. (Patient not taking: Reported on 08/02/2022) 14 each 0   vitamin B-12 (CYANOCOBALAMIN) 1000 MCG tablet Take 1,000 mcg by mouth daily. (Patient not taking: Reported on 08/02/2022)     No facility-administered medications prior to visit.       Review of Systems  Review of Systems  Constitutional:  Positive for fatigue.  HENT: Negative.    Respiratory: Negative.    Cardiovascular: Negative.   Psychiatric/Behavioral:  Positive for sleep disturbance.      Physical Exam  BP 138/86 (BP Location: Left Arm, Patient Position: Sitting, Cuff Size: Large)   Pulse 71   Temp 99.5 F (37.5 C) (Oral)   Ht 5\' 11"  (1.803 m)   Wt (!) 330 lb 3.2 oz (149.8 kg)   SpO2 96%   BMI 46.05 kg/m  Physical Exam Constitutional:      Appearance: Normal appearance. He is obese.  HENT:     Head: Normocephalic and atraumatic.  Cardiovascular:     Rate and Rhythm: Normal rate and regular rhythm.  Pulmonary:     Effort: Pulmonary effort is normal.     Breath sounds: Normal breath sounds.  Musculoskeletal:        General: Normal range of motion.  Skin:    General: Skin is warm and dry.  Neurological:     General: No focal deficit present.     Mental Status: He is alert and oriented to person, place, and time. Mental status is at baseline.  Psychiatric:        Mood and Affect: Mood normal.        Behavior: Behavior normal.        Thought Content: Thought content normal.        Judgment: Judgment normal.      Lab Results:  CBC    Component Value Date/Time   WBC 7.1 12/23/2020 0324   RBC 4.34 12/23/2020 0324   HGB 13.4 12/23/2020 0324   HCT 42.0 12/23/2020 0324   PLT 297 12/23/2020 0324   MCV 96.8 12/23/2020 0324   MCH 30.9 12/23/2020 0324   MCHC 31.9 12/23/2020 0324   RDW 13.9 12/23/2020 0324   LYMPHSABS 2.0 12/19/2020 1928   MONOABS 0.5 12/19/2020 1928   EOSABS 0.1 12/19/2020 1928   BASOSABS 0.0 12/19/2020 1928    BMET    Component Value Date/Time   NA 136 12/23/2020 0324   K 3.4 (L) 12/23/2020 0324   CL 104 12/23/2020 0324   CO2 21 (L) 12/23/2020 0324   GLUCOSE 150 (H) 12/23/2020 0324   BUN 11 12/23/2020 0324   CREATININE 1.03 12/23/2020 0324   CALCIUM 9.0 12/23/2020 0324   GFRNONAA >60 12/23/2020 0324    GFRAA >60 04/13/2017 1309    BNP No results found for: "BNP"  ProBNP No results found for: "PROBNP"  Imaging: No results found.   Assessment & Plan:   Loud snoring - History of sleep apnea, previously intolerant to CPAP due to claustrophobia. Sleep study done in [place] greater than 10 years ago, results not available in chart for review.  Patient continues to have symptoms of loud snoring, waking up gasping and excessive daytime sleepiness.  Epworth  10.  BMI 46.  We reviewed risks of untreated sleep apnea including cardiac arrhythmias, pulmonary hypertension, diabetes and stroke.  We also discussed treatment options including weight loss, oral appliance, CPAP therapy referral to ENT for possible surgical options.  Patient is open to retrying CPAP with different mask.  Encouraged side sleeping position or wedge pillow to elevate head of bed 30 degrees.  He is motivated to lose weight.  Advised against driving if experiencing excessive daytime sleepiness fatigue.  Follow-up 1 to 2 weeks after sleep study review results and treatment options if needed.  Glenford Bayley, NP 08/02/2022

## 2022-08-02 NOTE — Assessment & Plan Note (Addendum)
-   History of sleep apnea, previously intolerant to CPAP due to claustrophobia. Sleep study done in [place] greater than 10 years ago, results not available in chart for review.  Patient continues to have symptoms of loud snoring, waking up gasping and excessive daytime sleepiness.  Epworth 10.  BMI 46.  We reviewed risks of untreated sleep apnea including cardiac arrhythmias, pulmonary hypertension, diabetes and stroke.  We also discussed treatment options including weight loss, oral appliance, CPAP therapy referral to ENT for possible surgical options.  Patient is open to retrying CPAP with different mask.  Encouraged side sleeping position or wedge pillow to elevate head of bed 30 degrees.  He is motivated to lose weight.  Advised against driving if experiencing excessive daytime sleepiness fatigue.  Follow-up 1 to 2 weeks after sleep study review results and treatment options if needed.

## 2022-08-02 NOTE — Patient Instructions (Addendum)
  Sleep apnea is defined as period of 10 seconds or longer when you stop breathing at night. This can happen multiple times a night. Dx sleep apnea is when this occurs more than 5 times an hour.    Mild OSA 5-15 apneic events an hour Moderate OSA 15-30 apneic events an hour Severe OSA > 30 apneic events an hour   Untreated sleep apnea puts you at higher risk for cardiac arrhythmias, pulmonary HTN, stroke and diabetes  Treatment options include weight loss, side sleeping position, oral appliance, CPAP therapy or referral to ENT for possible surgical options    Recommendations: Focus on side sleeping position or elevate head with wedge pillow 30 degrees Work on weight loss efforts if able  Do not drive if experiencing excessive daytime sleepiness of fatigue  Try either wedge pillow for back sleeping or look at medcline pillow for side sleepers   Orders: Home sleep study re: loud snoring    Follow-up: Please call to schedule follow-up 1-2 weeks after completing home sleep study to review results and treatment if needed (can be virtual)

## 2022-08-02 NOTE — Progress Notes (Signed)
Reviewed and agree with assessment/plan.   Coralyn Helling, MD [facility] Pulmonary/Critical [facility] 08/02/2022, 5:20 PM Pager:  [phone]

## 2022-09-30 ENCOUNTER — Telehealth: Payer: Self-pay | Admitting: Pulmonary Disease

## 2022-09-30 DIAGNOSIS — G4733 Obstructive sleep apnea (adult) (pediatric): Secondary | ICD-10-CM | POA: Diagnosis not present

## 2022-09-30 DIAGNOSIS — Z8669 Personal history of other diseases of the nervous system and sense organs: Secondary | ICD-10-CM

## 2022-09-30 DIAGNOSIS — R0681 Apnea, not elsewhere classified: Secondary | ICD-10-CM

## 2022-09-30 DIAGNOSIS — R0683 Snoring: Secondary | ICD-10-CM

## 2022-09-30 NOTE — Telephone Encounter (Signed)
Call patient  Sleep study result  Date of study: 08/10/2022  Impression: Central sleep apnea, moderate oxygen desaturations Suboptimal study with only 2 hours of sleep recorded, 4 hours of sleep is recommended for an optimal home sleep study   Recommendation:  Schedule for an in lab split-night study for optimal diagnosis and treatment of central sleep apnea  Close clinical follow-up of symptoms  Recommend aggressive weight loss measures

## 2022-10-02 ENCOUNTER — Ambulatory Visit: Payer: Federal, State, Local not specified - PPO | Admitting: Endocrinology

## 2022-10-03 NOTE — Telephone Encounter (Signed)
Please let patient know home sleep study showed central sleep apnea and oxygen desaturations. He did not get optimal number of hours of sleep. Needs split night study for accurate diagnosis. I will order

## 2022-10-04 NOTE — Telephone Encounter (Signed)
Called patient.  Gave information.  Patient in agreement to get a split night sleep study.  Beth has placed order.  Patient verbalized understanding.

## 2022-11-14 ENCOUNTER — Ambulatory Visit: Payer: Federal, State, Local not specified - PPO | Admitting: Endocrinology

## 2023-02-15 ENCOUNTER — Encounter (HOSPITAL_BASED_OUTPATIENT_CLINIC_OR_DEPARTMENT_OTHER): Payer: Federal, State, Local not specified - PPO | Admitting: Pulmonary Disease

## 2023-03-08 ENCOUNTER — Ambulatory Visit: Payer: Federal, State, Local not specified - PPO | Admitting: Endocrinology

## 2023-03-26 ENCOUNTER — Encounter (INDEPENDENT_AMBULATORY_CARE_PROVIDER_SITE_OTHER): Payer: Self-pay

## 2023-03-26 ENCOUNTER — Encounter (INDEPENDENT_AMBULATORY_CARE_PROVIDER_SITE_OTHER): Payer: Federal, State, Local not specified - PPO | Admitting: Ophthalmology

## 2023-03-26 DIAGNOSIS — I10 Essential (primary) hypertension: Secondary | ICD-10-CM

## 2023-03-26 DIAGNOSIS — H35033 Hypertensive retinopathy, bilateral: Secondary | ICD-10-CM

## 2023-03-26 DIAGNOSIS — E119 Type 2 diabetes mellitus without complications: Secondary | ICD-10-CM

## 2023-03-26 DIAGNOSIS — H25813 Combined forms of age-related cataract, bilateral: Secondary | ICD-10-CM

## 2023-03-27 IMAGING — DX DG ABD PORTABLE 1V
2 series · 2 of 2 positions shown · non-contrast
Comparison: Prior CT scan radiographs.

CLINICAL DATA: Small-bowel obstruction.

EXAM:
PORTABLE ABDOMEN - 1 VIEW

[abdomen kub (1 of 2)]
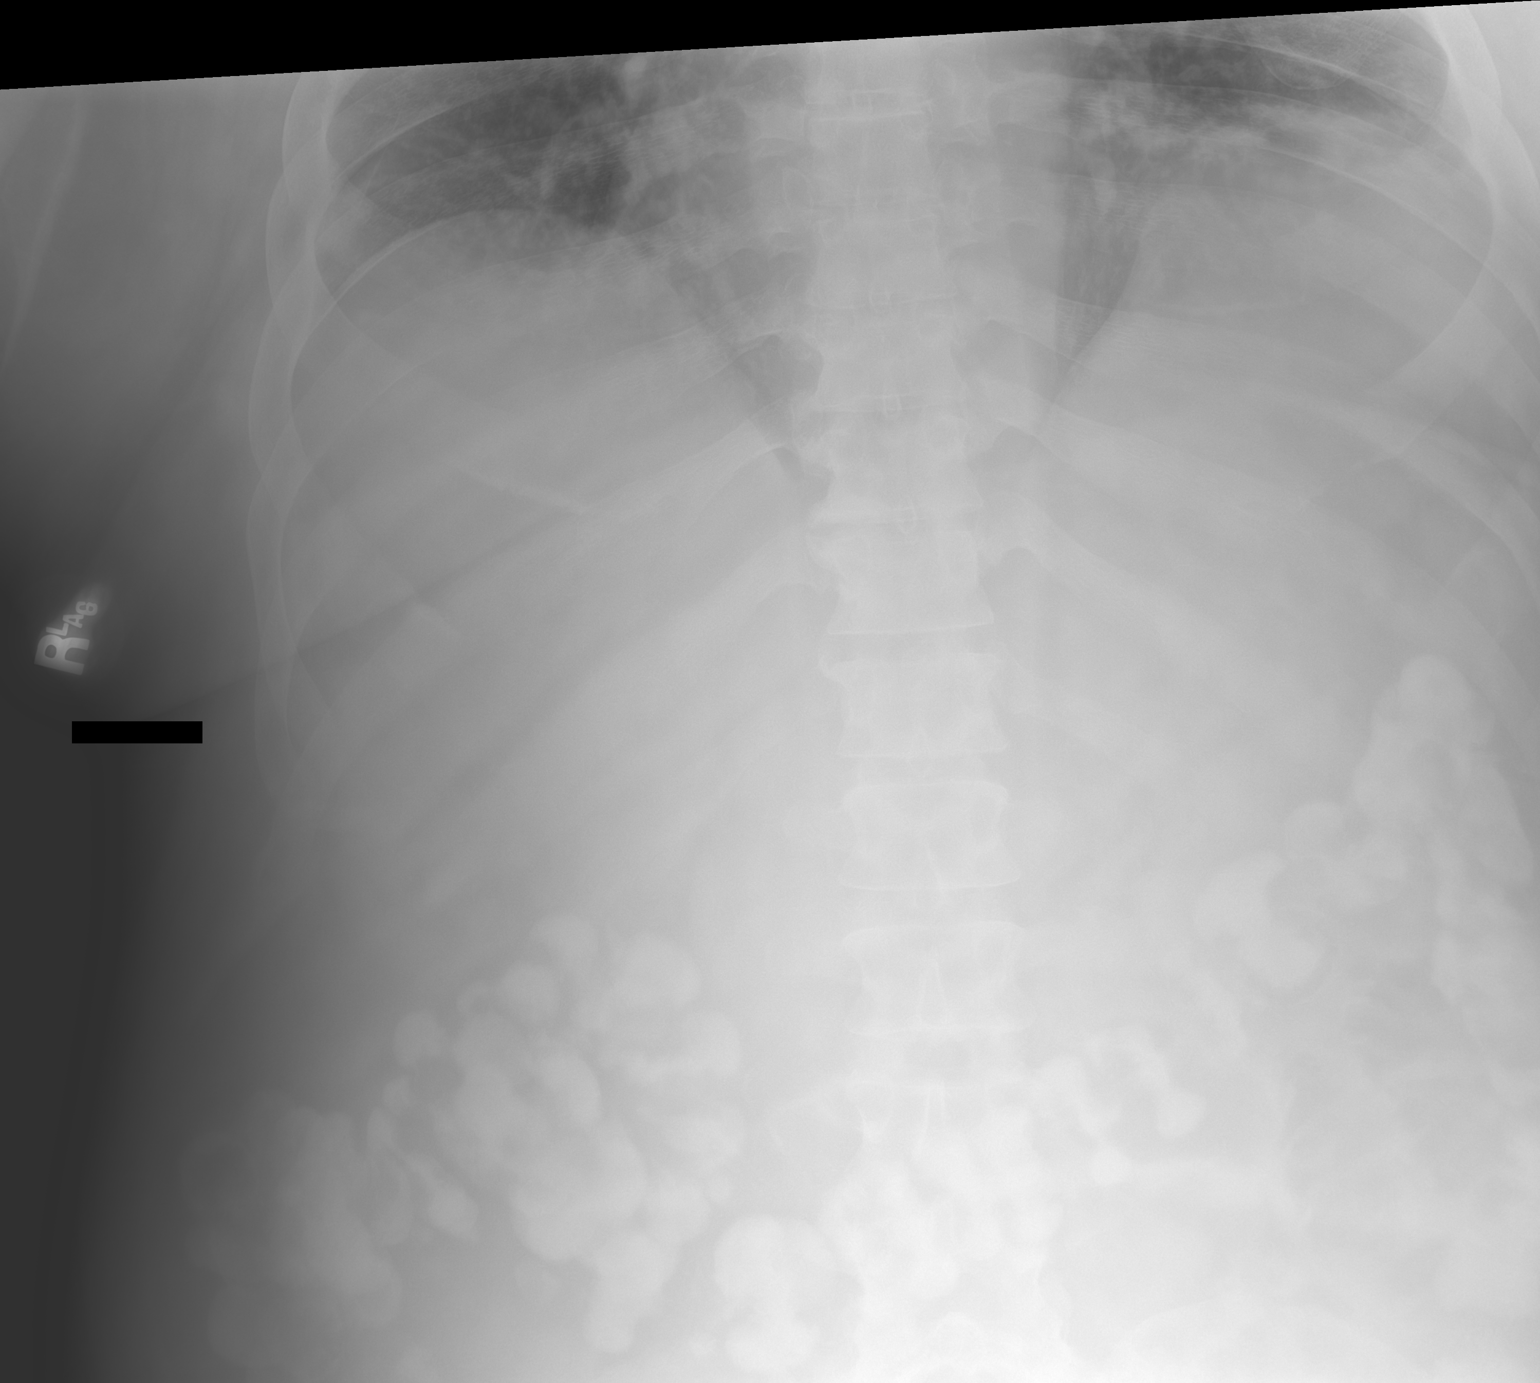

[abdomen kub (2 of 2)]
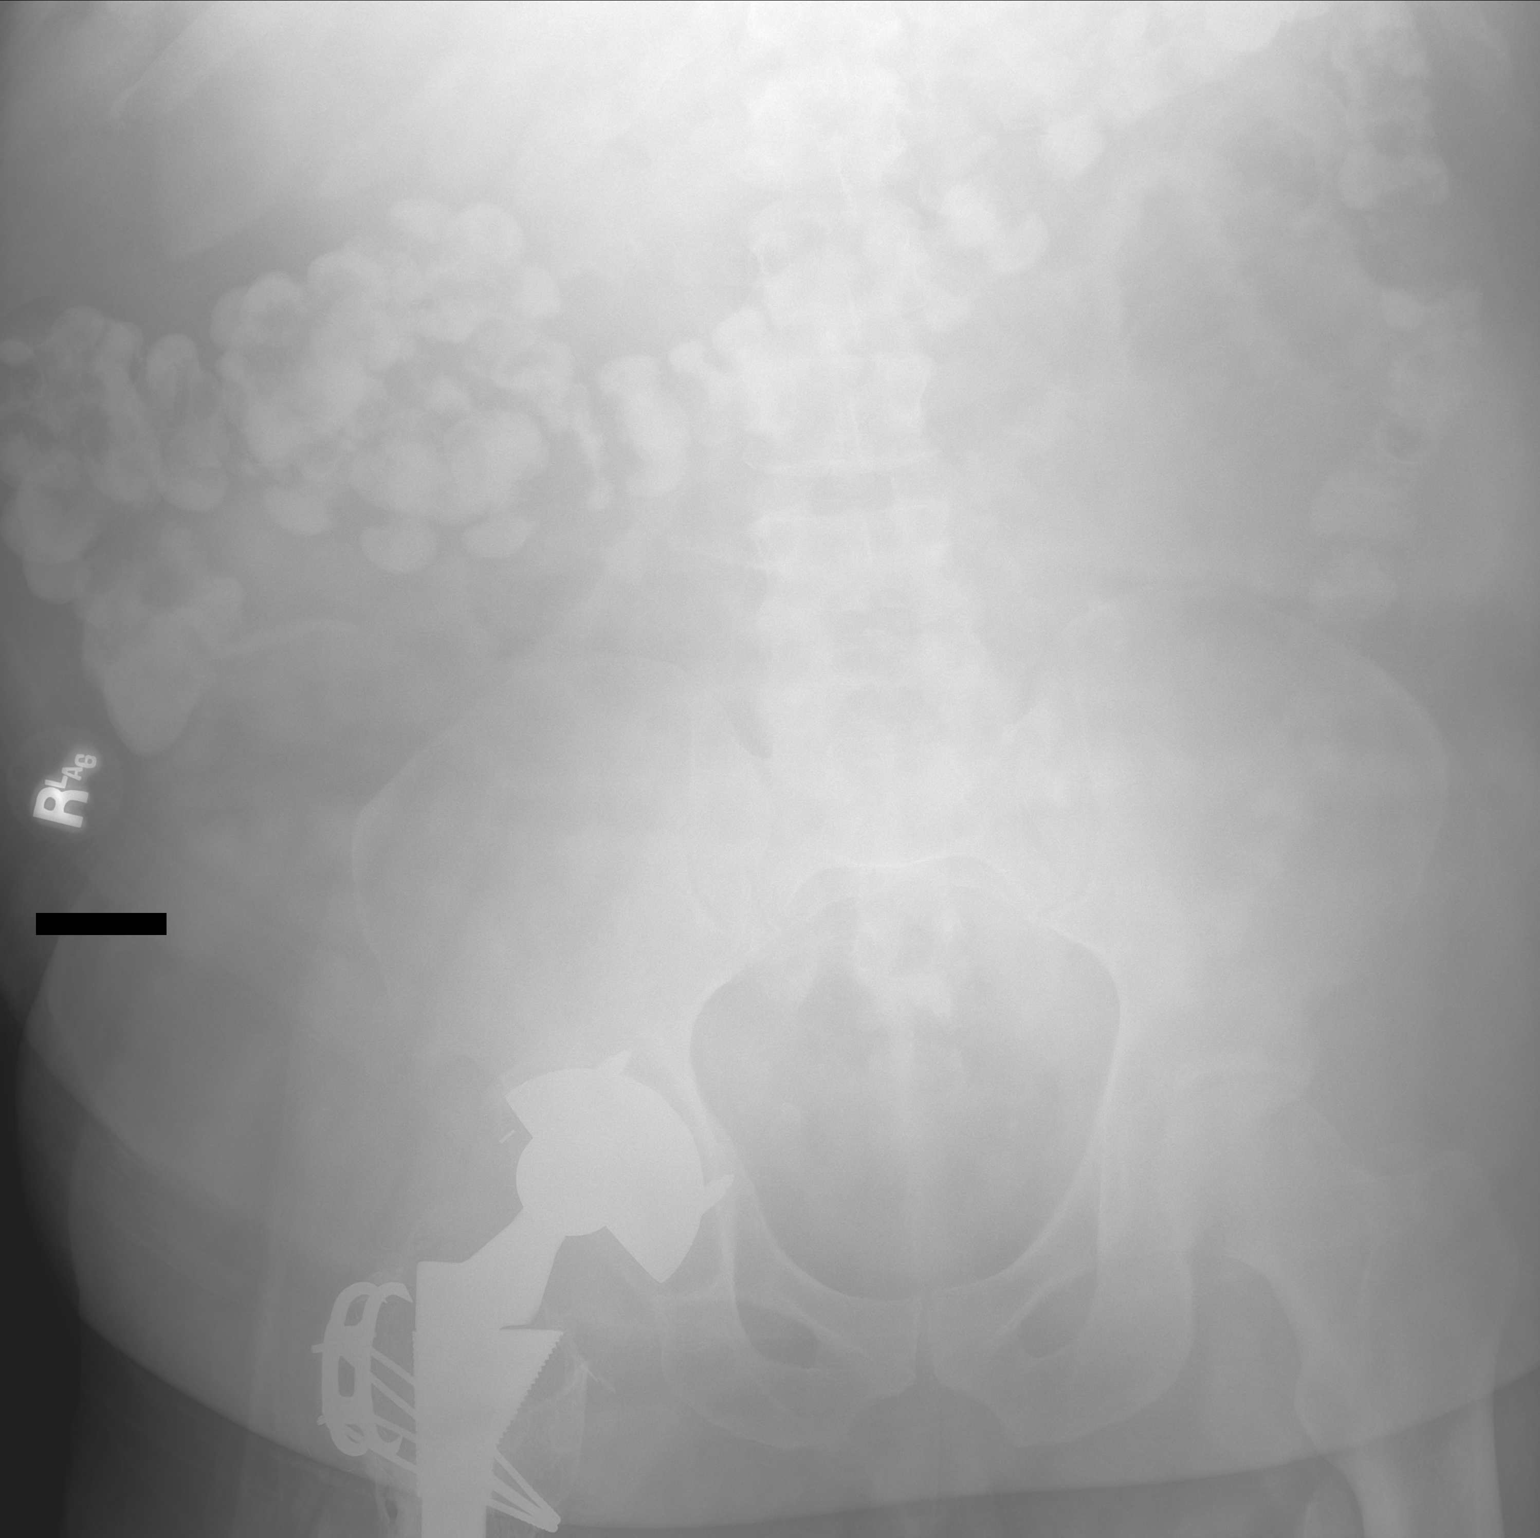

[2 of 2 positions shown; findings below may reference images not displayed]

FINDINGS: Contrast has now moved into the colon. The small bowel loops are
less dilated. Findings consistent with a resolving small bowel
obstruction.
IMPRESSION: Resolving small bowel obstruction.

## 2023-03-29 ENCOUNTER — Encounter (HOSPITAL_BASED_OUTPATIENT_CLINIC_OR_DEPARTMENT_OTHER): Payer: Federal, State, Local not specified - PPO | Admitting: Pulmonary Disease

## 2023-03-29 ENCOUNTER — Encounter (HOSPITAL_BASED_OUTPATIENT_CLINIC_OR_DEPARTMENT_OTHER): Payer: Self-pay

## 2023-06-01 ENCOUNTER — Encounter (INDEPENDENT_AMBULATORY_CARE_PROVIDER_SITE_OTHER): Admitting: Ophthalmology

## 2023-06-01 ENCOUNTER — Encounter (INDEPENDENT_AMBULATORY_CARE_PROVIDER_SITE_OTHER): Payer: Self-pay

## 2023-06-01 DIAGNOSIS — E119 Type 2 diabetes mellitus without complications: Secondary | ICD-10-CM

## 2023-06-01 DIAGNOSIS — H25813 Combined forms of age-related cataract, bilateral: Secondary | ICD-10-CM

## 2023-06-01 DIAGNOSIS — H35033 Hypertensive retinopathy, bilateral: Secondary | ICD-10-CM

## 2023-06-01 DIAGNOSIS — I10 Essential (primary) hypertension: Secondary | ICD-10-CM

## 2023-06-27 NOTE — Progress Notes (Shared)
 [facility] - Clinic Note  06/29/2023     CHIEF COMPLAINT Patient presents for No chief complaint on file.   HISTORY OF PRESENT ILLNESS: Kenneth Barron is a 57 y.o. male who presents to the clinic today for:    Pt is here on the referral of Dr. Tommy Frames, his PCP, for a DM exam, pt states he hasn't gotten new glasses in about 3 years, he last saw Dr. Buck Carbon for a routine eye exam, he states his eyes water a lot, his last A1c was 10.3 on 01.25.24, he takes Ozempic and tresiba for diabetes, his fasting blood sugar is in the high 170's, but he states it used to be around 119, pt denies kidney damage or peripheral neuropathy   Referring physician: Lemon Qua., MD No address on file  HISTORICAL INFORMATION:   Selected notes from the MEDICAL RECORD NUMBER Referred by Dr. Tommy Frames for DM exam LEE:  Ocular Hx- PMH-    CURRENT MEDICATIONS: No current outpatient medications on file. (Ophthalmic Drugs)   No current facility-administered medications for this visit. (Ophthalmic Drugs)   Current Outpatient Medications (Other)  Medication Sig   amLODipine  (NORVASC ) 10 MG tablet Take 10 mg by mouth daily.   ammonium lactate (LAC-HYDRIN) 12 % lotion Apply 1 application topically daily. (Patient not taking: Reported on 08/02/2022)   aspirin  EC 81 MG tablet Take 1 tablet (81 mg total) by mouth daily. Day after surgery (Patient not taking: Reported on 08/02/2022)   atorvastatin (LIPITOR) 80 MG tablet Take 80 mg by mouth daily. (Patient not taking: Reported on 08/02/2022)   gabapentin (NEURONTIN) 300 MG capsule Take 300 mg by mouth 2 (two) times daily.   Insulin  Degludec (TRESIBA) 100 UNIT/ML SOLN Inject 65 Units into the skin daily.   lisinopril-hydrochlorothiazide (PRINZIDE,ZESTORETIC) 20-25 MG tablet Take 1 tablet by mouth daily.   methocarbamol  (ROBAXIN ) 500 MG tablet Take 1 tablet (500 mg total) by mouth every 8 (eight) hours as needed for muscle spasms.   metoprolol  tartrate  (LOPRESSOR ) 25 MG tablet Take 25 mg by mouth 2 (two) times daily.   Semaglutide,0.25 or 0.5MG /DOS, (OZEMPIC, 0.25 OR 0.5 MG/DOSE,) 2 MG/1.5ML SOPN Inject 1 mg into the skin once a week. Sunday   sertraline  (ZOLOFT ) 50 MG tablet Take 50 mg by mouth daily.   No current facility-administered medications for this visit. (Other)   REVIEW OF SYSTEMS:   ALLERGIES Allergies  Allergen Reactions   Mixed Feathers Shortness Of Breath   Pollen Extract Shortness Of Breath   Other     Refuse blood products   Metformin And Related Diarrhea and Nausea And Vomiting   PAST MEDICAL HISTORY Past Medical History:  Diagnosis Date   Arthritis    Asthma    Diabetes mellitus without complication (HCC)    Type 2   Hypertension    Pneumonia    Past Surgical History:  Procedure Laterality Date   HIP SURGERY     2 surgeries   ROTATOR CUFF REPAIR Bilateral    SHOULDER ARTHROSCOPY WITH ROTATOR CUFF REPAIR AND SUBACROMIAL DECOMPRESSION Right 07/23/2020   Procedure: Right shoulder arthroscopy revision rotator cuff repair, subacromial decompression, distal clavicle resection, possible patch graft;  Surgeon: Orvan Blanch, MD;  Location: [facility];  Service: Orthopedics;  Laterality: Right;    FAMILY HISTORY Family History  Problem Relation Age of Onset   Glaucoma Mother    Diabetes Mother    Glaucoma Father    Cataracts Father  Diabetes Father    SOCIAL HISTORY Social History   Tobacco Use   Smoking status: Never   Smokeless tobacco: Never  Vaping Use   Vaping status: Never Used  Substance Use Topics   Alcohol use: Never   Drug use: Yes    Types: Marijuana       OPHTHALMIC EXAM:  Not recorded    IMAGING AND PROCEDURES  Imaging and Procedures for 06/29/2023          ASSESSMENT/PLAN:  No diagnosis found.  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   - The need for close monitoring of blood glucose, blood pressure, and serum lipids, avoiding cigarette or any type of tobacco, and the need for long term follow up was also discussed with patient. - f/u in 1 year, sooner prn  2,3. Hypertensive retinopathy OU - discussed importance of tight BP control - monitor  4. Mixed Cataract OU - The symptoms of cataract, surgical options, and treatments and risks were discussed with patient. - discussed diagnosis and progression - not yet visually significant - monitor  Ophthalmic Meds Ordered this visit:  No orders of the defined types were placed in this encounter.    No follow-ups on file.  There are no Patient Instructions on file for this visit.  This document serves as a record of services personally performed by Jeanice Millard, MD, PhD. It was created on their behalf by Eller Gut COT, an ophthalmic technician. The creation of this record is the provider's dictation and/or activities during the visit.    Electronically signed by: Eller Gut COT 05.21.2510:24 AM  Abbreviations: M myopia (nearsighted); A astigmatism; H hyperopia (farsighted); P presbyopia; Mrx spectacle prescription;  CTL contact lenses; OD right eye; OS left eye; OU both eyes  XT exotropia; ET esotropia; PEK punctate epithelial keratitis; PEE punctate epithelial erosions; DES dry eye syndrome; MGD meibomian gland dysfunction; ATs artificial tears; PFAT's preservative free artificial tears; NSC nuclear sclerotic cataract; PSC posterior subcapsular cataract; ERM epi-retinal membrane; PVD posterior vitreous detachment; RD retinal detachment; DM diabetes mellitus; DR diabetic retinopathy; NPDR non-proliferative diabetic retinopathy; PDR proliferative diabetic retinopathy; CSME clinically significant macular edema; DME diabetic macular edema; dbh  dot blot hemorrhages; CWS cotton wool spot; POAG primary open angle glaucoma; C/D cup-to-disc ratio; HVF humphrey visual field; GVF goldmann visual field; OCT optical coherence tomography; IOP intraocular pressure; BRVO Branch retinal vein occlusion; CRVO central retinal vein occlusion; CRAO central retinal artery occlusion; BRAO branch retinal artery occlusion; RT retinal tear; SB scleral buckle; PPV pars plana vitrectomy; VH Vitreous hemorrhage; PRP panretinal laser photocoagulation; IVK intravitreal kenalog; VMT vitreomacular traction; MH Macular hole;  NVD neovascularization of the disc; NVE neovascularization elsewhere; AREDS age related eye disease study; ARMD age related macular degeneration; POAG primary open angle glaucoma; EBMD epithelial/anterior basement membrane dystrophy; ACIOL anterior chamber intraocular lens; IOL intraocular lens; PCIOL posterior chamber intraocular lens; Phaco/IOL phacoemulsification with intraocular lens placement; PRK photorefractive keratectomy; LASIK laser assisted in situ keratomileusis; HTN hypertension; DM diabetes mellitus; COPD chronic obstructive pulmonary disease

## 2023-06-29 ENCOUNTER — Encounter (INDEPENDENT_AMBULATORY_CARE_PROVIDER_SITE_OTHER): Payer: Self-pay

## 2023-06-29 ENCOUNTER — Encounter (INDEPENDENT_AMBULATORY_CARE_PROVIDER_SITE_OTHER): Admitting: Ophthalmology

## 2023-07-05 NOTE — Progress Notes (Shared)
 [facility] - Clinic Note  07/06/2023     CHIEF COMPLAINT Patient presents for No chief complaint on file.   HISTORY OF PRESENT ILLNESS: Kenneth Barron is a 58 y.o. male who presents to the clinic today for:    Pt is here on the referral of Dr. Tommy Frames, his PCP, for a DM exam, pt states he hasn't gotten new glasses in about 3 years, he last saw Dr. Buck Carbon for a routine eye exam, he states his eyes water a lot, his last A1c was 10.3 on 01.25.24, he takes Ozempic and tresiba for diabetes, his fasting blood sugar is in the high 170's, but he states it used to be around 119, pt denies kidney damage or peripheral neuropathy   Referring physician: Lemon Qua., MD No address on file  HISTORICAL INFORMATION:   Selected notes from the MEDICAL RECORD NUMBER Referred by Dr. Tommy Frames for DM exam LEE:  Ocular Hx- PMH-    CURRENT MEDICATIONS: No current outpatient medications on file. (Ophthalmic Drugs)   No current facility-administered medications for this visit. (Ophthalmic Drugs)   Current Outpatient Medications (Other)  Medication Sig   amLODipine  (NORVASC ) 10 MG tablet Take 10 mg by mouth daily.   ammonium lactate (LAC-HYDRIN) 12 % lotion Apply 1 application topically daily. (Patient not taking: Reported on 08/02/2022)   aspirin  EC 81 MG tablet Take 1 tablet (81 mg total) by mouth daily. Day after surgery (Patient not taking: Reported on 08/02/2022)   atorvastatin (LIPITOR) 80 MG tablet Take 80 mg by mouth daily. (Patient not taking: Reported on 08/02/2022)   gabapentin (NEURONTIN) 300 MG capsule Take 300 mg by mouth 2 (two) times daily.   Insulin  Degludec (TRESIBA) 100 UNIT/ML SOLN Inject 65 Units into the skin daily.   lisinopril-hydrochlorothiazide (PRINZIDE,ZESTORETIC) 20-25 MG tablet Take 1 tablet by mouth daily.   methocarbamol  (ROBAXIN ) 500 MG tablet Take 1 tablet (500 mg total) by mouth every 8 (eight) hours as needed for muscle spasms.   metoprolol  tartrate  (LOPRESSOR ) 25 MG tablet Take 25 mg by mouth 2 (two) times daily.   Semaglutide,0.25 or 0.5MG /DOS, (OZEMPIC, 0.25 OR 0.5 MG/DOSE,) 2 MG/1.5ML SOPN Inject 1 mg into the skin once a week. Sunday   sertraline  (ZOLOFT ) 50 MG tablet Take 50 mg by mouth daily.   No current facility-administered medications for this visit. (Other)   REVIEW OF SYSTEMS:   ALLERGIES Allergies  Allergen Reactions   Mixed Feathers Shortness Of Breath   Pollen Extract Shortness Of Breath   Other     Refuse blood products   Metformin And Related Diarrhea and Nausea And Vomiting   PAST MEDICAL HISTORY Past Medical History:  Diagnosis Date   Arthritis    Asthma    Diabetes mellitus without complication (HCC)    Type 2   Hypertension    Pneumonia    Past Surgical History:  Procedure Laterality Date   HIP SURGERY     2 surgeries   ROTATOR CUFF REPAIR Bilateral    SHOULDER ARTHROSCOPY WITH ROTATOR CUFF REPAIR AND SUBACROMIAL DECOMPRESSION Right 07/23/2020   Procedure: Right shoulder arthroscopy revision rotator cuff repair, subacromial decompression, distal clavicle resection, possible patch graft;  Surgeon: Orvan Blanch, MD;  Location: [facility];  Service: Orthopedics;  Laterality: Right;    FAMILY HISTORY Family History  Problem Relation Age of Onset   Glaucoma Mother    Diabetes Mother    Glaucoma Father    Cataracts Father  Diabetes Father    SOCIAL HISTORY Social History   Tobacco Use   Smoking status: Never   Smokeless tobacco: Never  Vaping Use   Vaping status: Never Used  Substance Use Topics   Alcohol use: Never   Drug use: Yes    Types: Marijuana       OPHTHALMIC EXAM:  Not recorded    IMAGING AND PROCEDURES  Imaging and Procedures for 07/06/2023          ASSESSMENT/PLAN:  No diagnosis found.  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   - The need for close monitoring of blood glucose, blood pressure, and serum lipids, avoiding cigarette or any type of tobacco, and the need for long term follow up was also discussed with patient. - f/u in 1 year, sooner prn  2,3. Hypertensive retinopathy OU - discussed importance of tight BP control - monitor  4. Mixed Cataract OU - The symptoms of cataract, surgical options, and treatments and risks were discussed with patient. - discussed diagnosis and progression - not yet visually significant - monitor  Ophthalmic Meds Ordered this visit:  No orders of the defined types were placed in this encounter.    No follow-ups on file.  There are no Patient Instructions on file for this visit.   This document serves as a record of services personally performed by Jeanice Millard, MD, PhD. It was created on their behalf by Eller Gut COT, an ophthalmic technician. The creation of this record is the provider's dictation and/or activities during the visit.    Electronically signed by: Eller Gut COT 05.29.2025 9:50 AM   Abbreviations: M myopia (nearsighted); A astigmatism; H hyperopia (farsighted); P presbyopia; Mrx spectacle prescription;  CTL contact lenses; OD right eye; OS left eye; OU both eyes  XT exotropia; ET esotropia; PEK punctate epithelial keratitis; PEE punctate epithelial erosions; DES dry eye syndrome; MGD meibomian gland dysfunction; ATs artificial tears; PFAT's preservative free artificial tears; NSC nuclear sclerotic cataract; PSC posterior subcapsular cataract; ERM epi-retinal membrane; PVD posterior vitreous detachment; RD retinal detachment; DM diabetes mellitus; DR diabetic retinopathy; NPDR non-proliferative diabetic retinopathy; PDR proliferative diabetic retinopathy; CSME clinically significant macular edema; DME diabetic macular  edema; dbh dot blot hemorrhages; CWS cotton wool spot; POAG primary open angle glaucoma; C/D cup-to-disc ratio; HVF humphrey visual field; GVF goldmann visual field; OCT optical coherence tomography; IOP intraocular pressure; BRVO Branch retinal vein occlusion; CRVO central retinal vein occlusion; CRAO central retinal artery occlusion; BRAO branch retinal artery occlusion; RT retinal tear; SB scleral buckle; PPV pars plana vitrectomy; VH Vitreous hemorrhage; PRP panretinal laser photocoagulation; IVK intravitreal kenalog; VMT vitreomacular traction; MH Macular hole;  NVD neovascularization of the disc; NVE neovascularization elsewhere; AREDS age related eye disease study; ARMD age related macular degeneration; POAG primary open angle glaucoma; EBMD epithelial/anterior basement membrane dystrophy; ACIOL anterior chamber intraocular lens; IOL intraocular lens; PCIOL posterior chamber intraocular lens; Phaco/IOL phacoemulsification with intraocular lens placement; PRK photorefractive keratectomy; LASIK laser assisted in situ keratomileusis; HTN hypertension; DM diabetes mellitus; COPD chronic obstructive pulmonary disease

## 2023-07-06 ENCOUNTER — Encounter (INDEPENDENT_AMBULATORY_CARE_PROVIDER_SITE_OTHER): Admitting: Ophthalmology

## 2023-07-06 DIAGNOSIS — I10 Essential (primary) hypertension: Secondary | ICD-10-CM

## 2023-07-06 DIAGNOSIS — E119 Type 2 diabetes mellitus without complications: Secondary | ICD-10-CM

## 2023-07-06 DIAGNOSIS — H25813 Combined forms of age-related cataract, bilateral: Secondary | ICD-10-CM

## 2023-07-06 DIAGNOSIS — H35033 Hypertensive retinopathy, bilateral: Secondary | ICD-10-CM

## 2023-10-01 NOTE — Progress Notes (Signed)
 [facility] - Clinic Note  10/15/2023     CHIEF COMPLAINT Patient presents for Retina Follow Up   HISTORY OF PRESENT ILLNESS: Kenneth Barron is a 58 y.o. male who presents to the clinic today for:   HPI     Retina Follow Up   Patient presents with  Diabetic Retinopathy.  In both eyes.  This started 19 months ago.        Comments   Patent here for 1 yr (19 months) retina follow up for NPDR OU. Patient states vision may need new RX for glasses. Vision not as sharp as should be. Has crusty stuff in eyes all the time. OS has like an ache-tender irritated like something in it.       Last edited by Orval Asberry RAMAN, COA on 10/15/2023  1:37 PM.        Referring physician: Knox Toribio NOVAK., MD No address on file  HISTORICAL INFORMATION:   Selected notes from the MEDICAL RECORD NUMBER Referred by Dr. Knox for DM exam LEE:  Ocular Hx- PMH-    CURRENT MEDICATIONS: No current outpatient medications on file. (Ophthalmic Drugs)   No current facility-administered medications for this visit. (Ophthalmic Drugs)   Current Outpatient Medications (Other)  Medication Sig   amLODipine  (NORVASC ) 10 MG tablet Take 10 mg by mouth daily.   gabapentin (NEURONTIN) 300 MG capsule Take 300 mg by mouth 2 (two) times daily.   Insulin  Degludec (TRESIBA) 100 UNIT/ML SOLN Inject 65 Units into the skin daily.   lisinopril-hydrochlorothiazide (PRINZIDE,ZESTORETIC) 20-25 MG tablet Take 1 tablet by mouth daily.   methocarbamol  (ROBAXIN ) 500 MG tablet Take 1 tablet (500 mg total) by mouth every 8 (eight) hours as needed for muscle spasms.   metoprolol  tartrate (LOPRESSOR ) 25 MG tablet Take 25 mg by mouth 2 (two) times daily.   Semaglutide,0.25 or 0.5MG /DOS, (OZEMPIC, 0.25 OR 0.5 MG/DOSE,) 2 MG/1.5ML SOPN Inject 1 mg into the skin once a week. Sunday   sertraline  (ZOLOFT ) 50 MG tablet Take 50 mg by mouth daily.   ammonium lactate (LAC-HYDRIN) 12 % lotion Apply 1 application  topically daily. (Patient not taking: Reported on 08/02/2022)   aspirin  EC 81 MG tablet Take 1 tablet (81 mg total) by mouth daily. Day after surgery (Patient not taking: Reported on 08/02/2022)   atorvastatin (LIPITOR) 80 MG tablet Take 80 mg by mouth daily. (Patient not taking: Reported on 08/02/2022)   No current facility-administered medications for this visit. (Other)   REVIEW OF SYSTEMS: ROS   Positive for: Musculoskeletal, Endocrine, Cardiovascular, Eyes, Psychiatric Negative for: Constitutional, Gastrointestinal, Neurological, Skin, Genitourinary, HENT, Respiratory, Allergic/Imm, Heme/Lymph Last edited by Orval Asberry RAMAN, COA on 10/15/2023  1:37 PM.      ALLERGIES Allergies  Allergen Reactions   Mixed Feathers Shortness Of Breath   Pollen Extract Shortness Of Breath   Other     Refuse blood products   Metformin And Related Diarrhea and Nausea And Vomiting   PAST MEDICAL HISTORY Past Medical History:  Diagnosis Date   Arthritis    Asthma    Diabetes mellitus without complication (HCC)    Type 2   Hypertension    Pneumonia    Past Surgical History:  Procedure Laterality Date   HIP SURGERY     2 surgeries   ROTATOR CUFF REPAIR Bilateral    SHOULDER ARTHROSCOPY WITH ROTATOR CUFF REPAIR AND SUBACROMIAL DECOMPRESSION Right 07/23/2020   Procedure: Right shoulder arthroscopy revision rotator cuff repair, subacromial decompression,  distal clavicle resection, possible patch graft;  Surgeon: Duwayne Purchase, MD;  Location: [facility];  Service: Orthopedics;  Laterality: Right;    FAMILY HISTORY Family History  Problem Relation Age of Onset   Glaucoma Mother    Diabetes Mother    Glaucoma Father    Cataracts Father    Diabetes Father    SOCIAL HISTORY Social History   Tobacco Use   Smoking status: Never   Smokeless tobacco: Never  Vaping Use   Vaping status: Never Used  Substance Use Topics   Alcohol use: Never   Drug use: Yes    Types: Marijuana        OPHTHALMIC EXAM:  Base Eye Exam     Visual Acuity (Snellen - Linear)       Right Left   Dist cc 20/25 -1 20/20 -2    Correction: Glasses         Tonometry (Tonopen, 1:34 PM)       Right Left   Pressure 19 17         Pupils       Dark Light Shape React APD   Right 4 3 Round Brisk None   Left 4 3 Round Brisk None         Visual Fields (Counting fingers)       Left Right    Full Full         Extraocular Movement       Right Left    Full, Ortho Full, Ortho         Neuro/Psych     Oriented x3: Yes   Mood/Affect: Normal         Dilation     Both eyes: 1.0% Mydriacyl, 2.5% Phenylephrine  @ 1:34 PM           Slit Lamp and Fundus Exam     Slit Lamp Exam       Right Left   Lids/Lashes Normal Normal   Conjunctiva/Sclera White and quiet White and quiet   Cornea 2-3+ fine Punctate epithelial erosions 2+ fine Punctate epithelial erosions   Anterior Chamber deep and clear deep and clear   Iris Round and dilated, No NVI Round and dilated, No NVI   Lens 2+ Nuclear sclerosis, 2+ Cortical cataract 2+ Nuclear sclerosis, 2+ Cortical cataract   Anterior Vitreous mild syneresis mild syneresis         Fundus Exam       Right Left   Disc Pink and Sharp, mild PPA Pink and Sharp   C/D Ratio 0.1 0.1   Macula Flat, Good foveal reflex, No heme or edema Flat, Good foveal reflex, No heme or edema   Vessels attenuated, Tortuous, mild AV crossing changes attenuated, Tortuous, mild AV crossing changes   Periphery Attached, prominent vitreous base vs shallow schisis nasally, No heme Attached, No heme           Refraction     Wearing Rx       Sphere Cylinder Axis Add   Right +6.00 +1.00 086 +2.50   Left +5.25 +0.50 093 +2.50           IMAGING AND PROCEDURES  Imaging and Procedures for 10/15/2023  OCT, Retina - OU - Both Eyes       Right Eye Quality was good. Central Foveal Thickness: 264. Progression has been stable. Findings include normal  foveal contour, no IRF, no SRF, vitreomacular adhesion .   Left Eye Quality was good. Central  Foveal Thickness: 261. Progression has been stable. Findings include normal foveal contour, no IRF, no SRF, vitreomacular adhesion .   Notes *Images captured and stored on drive  Diagnosis / Impression:  NFP; no IRF/SRF OU No DME OU  Clinical management:  See below  Abbreviations: NFP - Normal foveal profile. CME - cystoid macular edema. PED - pigment epithelial detachment. IRF - intraretinal fluid. SRF - subretinal fluid. EZ - ellipsoid zone. ERM - epiretinal membrane. ORA - outer retinal atrophy. ORT - outer retinal tubulation. SRHM - subretinal hyper-reflective material. IRHM - intraretinal hyper-reflective material             ASSESSMENT/PLAN:    ICD-10-CM   1. Diabetes mellitus type 2 without retinopathy (HCC)  E11.9 OCT, Retina - OU - Both Eyes    2. Diabetes mellitus treated with injections of non-insulin  medication (HCC)  E11.9    Z79.85     3. Diabetes mellitus treated with oral medication (HCC)  E11.9    Z79.84     4. Encounter for long-term (current) use of insulin  (HCC)  Z79.4     5. Essential hypertension  I10     6. Hypertensive retinopathy of both eyes  H35.033     7. Combined forms of age-related cataract of both eyes  H25.813       1-4. 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 Alexi Radionchenko in [place] - The incidence, risk factors for progression, natural history and treatment options for diabetic retinopathy were discussed with patient.   - The need for close monitoring of blood glucose, blood pressure, and serum lipids, avoiding cigarette or any type of tobacco, and the need for long term follow up was also discussed with patient. - f/u in 1 year, sooner PRN  5,6. Hypertensive retinopathy OU - discussed importance of tight BP control - monitor  7. Mixed  Cataract OU - The symptoms of cataract, surgical options, and treatments and risks were discussed with patient. - discussed diagnosis and progression - not yet visually significant - monitor  Ophthalmic Meds Ordered this visit:  No orders of the defined types were placed in this encounter.    Return in about 1 year (around 10/14/2024) for f/u DM w/o , DFE, OCT.  There are no Patient Instructions on file for this visit.   Explained the diagnoses, plan, and follow up with the patient and they expressed understanding.  Patient expressed understanding of the importance of proper follow up care.   This document serves as a record of services personally performed by Redell JUDITHANN Hans, MD, PhD. It was created on their behalf by Avelina Pereyra, COA an ophthalmic technician. The creation of this record is the provider's dictation and/or activities during the visit.   Electronically signed by: Avelina GORMAN Pereyra, COT  10/15/23  2:27 PM   This document serves as a record of services personally performed by Redell JUDITHANN Hans, MD, PhD. It was created on their behalf by Wanda GEANNIE Keens, COT an ophthalmic technician. The creation of this record is the provider's dictation and/or activities during the visit.    Electronically signed by:  Wanda GEANNIE Keens, COT  10/15/23 2:27 PM  Redell JUDITHANN Hans, M.D., Ph.D. Diseases & Surgery of the Retina and Vitreous [facility]    Abbreviations: M myopia (nearsighted); A astigmatism; H hyperopia (farsighted); P presbyopia; Mrx spectacle prescription;  CTL contact lenses; OD right  eye; OS left eye; OU both eyes  XT exotropia; ET esotropia; PEK punctate epithelial keratitis; PEE punctate epithelial erosions; DES dry eye syndrome; MGD meibomian gland dysfunction; ATs artificial tears; PFAT's preservative free artificial tears; NSC nuclear sclerotic cataract; PSC posterior subcapsular cataract; ERM epi-retinal membrane; PVD posterior vitreous  detachment; RD retinal detachment; DM diabetes mellitus; DR diabetic retinopathy; NPDR non-proliferative diabetic retinopathy; PDR proliferative diabetic retinopathy; CSME clinically significant macular edema; DME diabetic macular edema; dbh dot blot hemorrhages; CWS cotton wool spot; POAG primary open angle glaucoma; C/D cup-to-disc ratio; HVF humphrey visual field; GVF goldmann visual field; OCT optical coherence tomography; IOP intraocular pressure; BRVO Branch retinal vein occlusion; CRVO central retinal vein occlusion; CRAO central retinal artery occlusion; BRAO branch retinal artery occlusion; RT retinal tear; SB scleral buckle; PPV pars plana vitrectomy; VH Vitreous hemorrhage; PRP panretinal laser photocoagulation; IVK intravitreal kenalog; VMT vitreomacular traction; MH Macular hole;  NVD neovascularization of the disc; NVE neovascularization elsewhere; AREDS age related eye disease study; ARMD age related macular degeneration; POAG primary open angle glaucoma; EBMD epithelial/anterior basement membrane dystrophy; ACIOL anterior chamber intraocular lens; IOL intraocular lens; PCIOL posterior chamber intraocular lens; Phaco/IOL phacoemulsification with intraocular lens placement; PRK photorefractive keratectomy; LASIK laser assisted in situ keratomileusis; HTN hypertension; DM diabetes mellitus; COPD chronic obstructive pulmonary disease

## 2023-10-15 ENCOUNTER — Ambulatory Visit (INDEPENDENT_AMBULATORY_CARE_PROVIDER_SITE_OTHER): Admitting: Ophthalmology

## 2023-10-15 ENCOUNTER — Encounter (INDEPENDENT_AMBULATORY_CARE_PROVIDER_SITE_OTHER): Payer: Self-pay | Admitting: Ophthalmology

## 2023-10-15 DIAGNOSIS — Z7985 Long-term (current) use of injectable non-insulin antidiabetic drugs: Secondary | ICD-10-CM | POA: Diagnosis not present

## 2023-10-15 DIAGNOSIS — I10 Essential (primary) hypertension: Secondary | ICD-10-CM

## 2023-10-15 DIAGNOSIS — E119 Type 2 diabetes mellitus without complications: Secondary | ICD-10-CM

## 2023-10-15 DIAGNOSIS — Z794 Long term (current) use of insulin: Secondary | ICD-10-CM

## 2023-10-15 DIAGNOSIS — Z7984 Long term (current) use of oral hypoglycemic drugs: Secondary | ICD-10-CM

## 2023-10-15 DIAGNOSIS — H25813 Combined forms of age-related cataract, bilateral: Secondary | ICD-10-CM

## 2023-10-15 DIAGNOSIS — H35033 Hypertensive retinopathy, bilateral: Secondary | ICD-10-CM

## 2023-10-19 ENCOUNTER — Encounter (INDEPENDENT_AMBULATORY_CARE_PROVIDER_SITE_OTHER): Payer: Self-pay | Admitting: Ophthalmology

## 2024-10-20 ENCOUNTER — Encounter (INDEPENDENT_AMBULATORY_CARE_PROVIDER_SITE_OTHER): Admitting: Ophthalmology
